# Patient Record
Sex: Female | Born: 1940 | Race: Black or African American | Hispanic: No | Marital: Married | Smoking: Never smoker
Health system: Southern US, Community
[De-identification: ages and names within clinical notes are randomized; demographics above are authoritative.]

---

## 1999-10-22 ENCOUNTER — Ambulatory Visit (HOSPITAL_COMMUNITY): Admission: RE | Admit: 1999-10-22 | Discharge: 1999-10-22 | Payer: Self-pay | Admitting: Cardiovascular Disease

## 2001-04-14 ENCOUNTER — Emergency Department (HOSPITAL_COMMUNITY): Admission: EM | Admit: 2001-04-14 | Discharge: 2001-04-14 | Payer: Self-pay | Admitting: Emergency Medicine

## 2001-04-14 ENCOUNTER — Encounter: Payer: Self-pay | Admitting: Emergency Medicine

## 2002-05-31 ENCOUNTER — Emergency Department (HOSPITAL_COMMUNITY): Admission: EM | Admit: 2002-05-31 | Discharge: 2002-05-31 | Payer: Self-pay | Admitting: Emergency Medicine

## 2007-10-05 ENCOUNTER — Inpatient Hospital Stay (HOSPITAL_COMMUNITY): Admission: AD | Admit: 2007-10-05 | Discharge: 2007-10-06 | Payer: Self-pay | Admitting: Cardiovascular Disease

## 2009-10-08 ENCOUNTER — Emergency Department (HOSPITAL_COMMUNITY): Admission: EM | Admit: 2009-10-08 | Discharge: 2009-10-08 | Payer: Self-pay | Admitting: Family Medicine

## 2009-10-08 ENCOUNTER — Emergency Department (HOSPITAL_COMMUNITY): Admission: EM | Admit: 2009-10-08 | Discharge: 2009-10-08 | Payer: Self-pay | Admitting: Emergency Medicine

## 2010-12-03 ENCOUNTER — Inpatient Hospital Stay (INDEPENDENT_AMBULATORY_CARE_PROVIDER_SITE_OTHER)
Admission: RE | Admit: 2010-12-03 | Discharge: 2010-12-03 | Disposition: A | Discharge status: Home / Self Care | Payer: Medicare Other | Source: Ambulatory Visit | Attending: Family Medicine | Admitting: Family Medicine

## 2010-12-03 DIAGNOSIS — S20219A Contusion of unspecified front wall of thorax, initial encounter: Secondary | ICD-10-CM

## 2010-12-07 LAB — POCT URINALYSIS DIP (DEVICE)
Bilirubin Urine: NEGATIVE
Ketones, ur: NEGATIVE mg/dL
Nitrite: NEGATIVE
Protein, ur: NEGATIVE mg/dL

## 2010-12-07 LAB — DIFFERENTIAL
Basophils Absolute: 0 10*3/uL (ref 0.0–0.1)
Eosinophils Relative: 1 % (ref 0–5)
Lymphocytes Relative: 13 % (ref 12–46)
Lymphs Abs: 0.6 10*3/uL — ABNORMAL LOW (ref 0.7–4.0)
Neutro Abs: 4 10*3/uL (ref 1.7–7.7)

## 2010-12-07 LAB — CBC
HCT: 36.8 % (ref 36.0–46.0)
Platelets: 257 10*3/uL (ref 150–400)
WBC: 4.8 10*3/uL (ref 4.0–10.5)

## 2010-12-11 ENCOUNTER — Other Ambulatory Visit: Payer: Self-pay | Admitting: Cardiovascular Disease

## 2010-12-11 DIAGNOSIS — Z1231 Encounter for screening mammogram for malignant neoplasm of breast: Secondary | ICD-10-CM

## 2010-12-15 ENCOUNTER — Ambulatory Visit
Admission: RE | Admit: 2010-12-15 | Discharge: 2010-12-15 | Disposition: A | Discharge status: Home / Self Care | Payer: BC Managed Care – PPO | Source: Ambulatory Visit | Attending: Cardiovascular Disease | Admitting: Cardiovascular Disease

## 2010-12-15 ENCOUNTER — Other Ambulatory Visit: Payer: Self-pay | Admitting: Cardiovascular Disease

## 2010-12-15 DIAGNOSIS — M79601 Pain in right arm: Secondary | ICD-10-CM

## 2010-12-15 DIAGNOSIS — R200 Anesthesia of skin: Secondary | ICD-10-CM

## 2010-12-18 ENCOUNTER — Other Ambulatory Visit: Payer: Self-pay | Admitting: Cardiovascular Disease

## 2010-12-18 ENCOUNTER — Ambulatory Visit: Payer: Self-pay

## 2010-12-18 DIAGNOSIS — M79601 Pain in right arm: Secondary | ICD-10-CM

## 2010-12-18 DIAGNOSIS — R42 Dizziness and giddiness: Secondary | ICD-10-CM

## 2010-12-18 DIAGNOSIS — R55 Syncope and collapse: Secondary | ICD-10-CM

## 2010-12-18 DIAGNOSIS — R200 Anesthesia of skin: Secondary | ICD-10-CM

## 2010-12-18 DIAGNOSIS — M542 Cervicalgia: Secondary | ICD-10-CM

## 2010-12-24 ENCOUNTER — Inpatient Hospital Stay: Admission: RE | Admit: 2010-12-24 | Payer: Self-pay | Source: Ambulatory Visit

## 2010-12-24 ENCOUNTER — Ambulatory Visit
Admission: RE | Admit: 2010-12-24 | Discharge: 2010-12-24 | Disposition: A | Discharge status: Home / Self Care | Payer: BC Managed Care – PPO | Source: Ambulatory Visit | Attending: Cardiovascular Disease | Admitting: Cardiovascular Disease

## 2010-12-24 DIAGNOSIS — R55 Syncope and collapse: Secondary | ICD-10-CM

## 2010-12-24 DIAGNOSIS — R42 Dizziness and giddiness: Secondary | ICD-10-CM

## 2010-12-24 DIAGNOSIS — R200 Anesthesia of skin: Secondary | ICD-10-CM

## 2010-12-24 DIAGNOSIS — M79601 Pain in right arm: Secondary | ICD-10-CM

## 2010-12-24 DIAGNOSIS — M542 Cervicalgia: Secondary | ICD-10-CM

## 2010-12-25 ENCOUNTER — Other Ambulatory Visit: Payer: Self-pay

## 2011-02-02 NOTE — Cardiovascular Report (Signed)
NAME:  [name], [name] NO.:  [id number]   MEDICAL RECORD NO.:  [id number]          PATIENT TYPE:  INP   LOCATION:  4703                         FACILITY:  [facility]   PHYSICIAN:  Ricki Rodriguez, M.D.  DATE OF BIRTH:  28-Feb-1941   DATE OF PROCEDURE:  10/06/2007  DATE OF DISCHARGE:                            CARDIAC CATHETERIZATION   Left heart catheterization, selective coronary angiography, left  ventricular function study.   INDICATIONS:  This 70 year old black female with a known coronary artery  disease had typical chest pain and EKG changes of lateral ischemia.   APPROACH:  Right femoral artery using 4-French sheath and catheters.   COMPLICATIONS:  None.   45 mL of dye was used.   HEMODYNAMIC DATA:  The aortic pressure was 101/65 and left ventricular  pressure was 100/11.   CORONARY ANATOMY:  The left main coronary artery had minimal  irregularities, otherwise was unremarkable.   Left anterior descending coronary artery:  The left anterior descending  coronary artery also showed luminal irregularities in the proximal  portion.  Rest of the vessel and diagonal vessels were unremarkable.   Left circumflex coronary artery:  The left circumflex coronary artery  and its obtuse marginal branch were unremarkable.   Right coronary artery:  The right coronary artery was dominant and  unremarkable.   Left ventriculogram:  The left ventriculogram was normal with an  ejection fraction of 65%.   IMPRESSION:  1. Minimal coronary artery disease.  2. Normal left ventricular systolic function.   RECOMMENDATIONS:  This patient will be treated medically.      Ricki Rodriguez, M.D.  Electronically Signed     ASK/MEDQ  D:  10/06/2007  T:  10/06/2007  Job:  [id number]

## 2011-02-05 NOTE — Discharge Summary (Signed)
[name], [name] NO.:  [id number]   MEDICAL RECORD NO.:  [id number]          PATIENT TYPE:  INP   LOCATION:  4703                         FACILITY:  [facility]   PHYSICIAN:  Ricki Rodriguez, M.D.  DATE OF BIRTH:  02/01/1941   DATE OF ADMISSION:  10/05/2007  DATE OF DISCHARGE:  10/06/2007                               DISCHARGE SUMMARY   FINAL DIAGNOSIS:  Coronary atherosclerosis of native coronary vessel.   PRINCIPAL PROCEDURE:  Left heart catheterization, selective coronary  angiography, left ventricle function study done by Dr. Orpah Cobb on  October 06, 2007.   DISCHARGE MEDICATIONS:  1. Toprol XL 25 mg 1 daily.  2. Aspirin 81 mg 1 daily.  3. Flexeril 10 mg 1/2 tablet at bedtime as needed.  4. Zocor 20 mg 1 daily.   DISCHARGE DIET:  Low-sodium heart-healthy diet.   DISCHARGE ACTIVITY:  The patient to increase activity slowly.   FOLLOWUP:  Followup by Dr. Orpah Cobb in 3 to 4 weeks.  The patient to  call [phone] for appointment.   HISTORY:  This 70 year old black female presented with left-sided chest  pain, left arm numbness along with nausea without shortness of breath.  Had minimal coronary artery disease 8 years ago on cardiac  catheterization.   PHYSICAL EXAMINATION:  VITAL SIGNS:  Temperature 98, pulse 58,  respirations 16, blood pressure 115/70.  GENERAL:  The patient is a well-built, well-nourished black female in no  acute distress.  HEENT:  The patient is normocephalic, atraumatic with pupils equally  reacting to light.  NECK:  Supple.  LUNGS:  Clear bilaterally.  HEART:  Normal S1-S2.  ABDOMEN:  Soft and nontender.  EXTREMITIES:  No edema, cyanosis, clubbing.  SKIN:  No rash.  CNS:  Cranial nerves grossly intact.  The patient moves all four  extremities.   LABORATORY DATA:  Near-normal hemoglobin/hematocrit, WBC count, platelet  count.  Normal electrolytes, BUN, creatinine and glucose with cardiac  enzymes normal x 2.  EKG  normal sinus rhythm with nonspecific T-wave changes.  Cardiac catheterization showed minimal coronary artery disease and  normal LV systolic function.   HOSPITAL COURSE:  The patient was admitted to telemetry unit.  Myocardial infarction was ruled out.  She underwent cardiac  catheterization that failed to show any significant coronary artery  disease.  Hence, the patient was discharged home with adjustment of her  medications, and she will be followed by me in 3 to 4 weeks.      Ricki Rodriguez, M.D.  Electronically Signed     ASK/MEDQ  D:  11/08/2007  T:  11/09/2007  Job:  [id number]

## 2011-02-05 NOTE — Cardiovascular Report (Signed)
[place]  Patient:    [name]                 MRN: [id number] Proc. Date: 10/22/99 Adm. Date:  [id number] Attending:  Ricki Rodriguez CC:         Cardiac Catheterization Laboratory                        Cardiac Catheterization  CINE:  [id number]  PROCEDURE:  Left heart catheterization, selective coronary angiography, left ventricular function study, and descending aortography.  CARDIOLOGIST:  Ricki Rodriguez, M.D.  INDICATIONS:  This 70 year old obese black female has exertional and nonexertional chest pain, along with an abnormal treadmill stress test and abnormal stress echocardiogram, with a known history of coronary artery disease in the past.  COMPLICATIONS:  None; however, there was technical difficulty in deploying the Perclose suture.  Manual pressure was held, with no hematoma.  APPROACH:  Right femoral artery using 6-French diagnostic catheters.  HEMODYNAMIC DATA: Left ventricular pressure:  129/17. Aortic pressure:  126/60.  LEFT VENTRICULOGRAM:  The left ventriculogram showed a mild anterior wall and apical hypokinesia, with an ejection fraction of 50%-55%.  DESCENDING AORTOGRAM:  The descending aortogram was essentially unremarkable; however, there were mild luminal irregularities of both renal arteries.  CORONARY ANATOMY: 1. Left main coronary artery:  The left main coronary artery was unremarkable. 2. Left anterior descending coronary artery:  The left anterior descending coronary    artery showed diffuse narrowing of the distal half of the vessel, and the    diagonal-I had ostial 20%-30% stenosis. 3. Left circumflex coronary artery:  The left circumflex coronary artery was    essentially unremarkable. 4. Right coronary artery:  The right coronary artery was dominant and had a    total occlusion of the distal branch of the posterolateral artery.  IMPRESSION: 1. Mild coronary artery disease. 2. Mild left  ventricular systolic dysfunction. 3. Minimal general artery disease.  RECOMMENDATIONS:  This patient will be treated medically.DD:  10/22/99 TD:  10/22/99 Job: [id number]

## 2011-06-11 LAB — BASIC METABOLIC PANEL
CO2: 28
Chloride: 103
Creatinine, Ser: 0.74
GFR calc Af Amer: >60
Potassium: 3.7
Sodium: 138

## 2011-06-11 LAB — CBC
HCT: 34.6 — ABNORMAL LOW
Hemoglobin: 11.9 — ABNORMAL LOW
MCHC: 34.5
MCV: 86
RBC: 4.03
WBC: 5.7

## 2011-06-11 LAB — CK TOTAL AND CKMB (NOT AT ARMC)
CK, MB: 0.9
CK, MB: 1.1
Relative Index: 1
Total CK: 106
Total CK: 112

## 2016-03-03 ENCOUNTER — Other Ambulatory Visit: Payer: Self-pay | Admitting: Radiology

## 2016-09-28 DIAGNOSIS — J22 Unspecified acute lower respiratory infection: Secondary | ICD-10-CM | POA: Diagnosis not present

## 2016-09-28 DIAGNOSIS — J019 Acute sinusitis, unspecified: Secondary | ICD-10-CM | POA: Diagnosis not present

## 2016-09-28 DIAGNOSIS — B9689 Other specified bacterial agents as the cause of diseases classified elsewhere: Secondary | ICD-10-CM | POA: Diagnosis not present

## 2016-10-05 DIAGNOSIS — B9689 Other specified bacterial agents as the cause of diseases classified elsewhere: Secondary | ICD-10-CM | POA: Diagnosis not present

## 2016-10-05 DIAGNOSIS — J019 Acute sinusitis, unspecified: Secondary | ICD-10-CM | POA: Diagnosis not present

## 2016-10-05 DIAGNOSIS — J22 Unspecified acute lower respiratory infection: Secondary | ICD-10-CM | POA: Diagnosis not present

## 2017-02-22 DIAGNOSIS — R921 Mammographic calcification found on diagnostic imaging of breast: Secondary | ICD-10-CM | POA: Diagnosis not present

## 2017-03-06 DIAGNOSIS — L0231 Cutaneous abscess of buttock: Secondary | ICD-10-CM | POA: Diagnosis not present

## 2017-04-06 ENCOUNTER — Encounter (HOSPITAL_COMMUNITY): Payer: Self-pay | Admitting: Emergency Medicine

## 2017-04-06 ENCOUNTER — Ambulatory Visit (INDEPENDENT_AMBULATORY_CARE_PROVIDER_SITE_OTHER): Payer: Medicare Other

## 2017-04-06 ENCOUNTER — Ambulatory Visit (HOSPITAL_COMMUNITY)
Admission: EM | Admit: 2017-04-06 | Discharge: 2017-04-06 | Disposition: A | Discharge status: Home / Self Care | Payer: Medicare Other | Attending: Internal Medicine | Admitting: Internal Medicine

## 2017-04-06 DIAGNOSIS — S60212A Contusion of left wrist, initial encounter: Secondary | ICD-10-CM

## 2017-04-06 DIAGNOSIS — M25532 Pain in left wrist: Secondary | ICD-10-CM

## 2017-04-06 DIAGNOSIS — M79642 Pain in left hand: Secondary | ICD-10-CM | POA: Diagnosis not present

## 2017-04-06 DIAGNOSIS — S6992XA Unspecified injury of left wrist, hand and finger(s), initial encounter: Secondary | ICD-10-CM | POA: Diagnosis not present

## 2017-04-06 NOTE — Discharge Instructions (Signed)
Xray was negative  Apply splint and call ortho office in the morning May use ice and heat as needed

## 2017-04-06 NOTE — ED Triage Notes (Signed)
PT fell at 1430. PT attempted to catch herself. PT had pain in left wrist but had full ROM. PT now reports limited ROM in left hand at 1730. Radial pulse intact.

## 2017-04-06 NOTE — ED Provider Notes (Signed)
CSN: [id number]     Arrival date & time 04/06/17  1952 History   First MD Initiated Contact with Patient 04/06/17 2014     Chief Complaint  Patient presents with  . Wrist Pain   (Consider location/radiation/quality/duration/timing/severity/associated sxs/prior Treatment) Pt fell a few minutes ago while getting into a car. States that she felt fine then she began having swelling to wrist and lt finger area. Denies any previous injury. Took an asa pta for pain.        History reviewed. No pertinent past medical history. History reviewed. No pertinent surgical history. No family history on file. Social History  Substance Use Topics  . Smoking status: Never Smoker  . Smokeless tobacco: Never Used  . Alcohol use No   OB History    No data available     Review of Systems  Constitutional: Negative.   Respiratory: Negative.   Cardiovascular: Negative.   Musculoskeletal: Positive for joint swelling.       Pain to lt wrist   Skin:       Swelling to wrist and lt hand area .   Neurological: Negative.     Allergies  Penicillins  Home Medications   Prior to Admission medications   Not on File   Meds Ordered and Administered this Visit  Medications - No data to display  BP 132/75   Pulse 86   Temp 100 F (37.8 C) (Oral)   Resp 16   Ht 5\' 6"  (1.676 m)   Wt 189 lb (85.7 kg)   SpO2 100%   BMI 30.51 kg/m  No data found.   Physical Exam  Constitutional: She appears well-developed.  Cardiovascular: Normal rate and regular rhythm.   Pulmonary/Chest: Effort normal and breath sounds normal.  Abdominal: Soft. Bowel sounds are normal.  Musculoskeletal: She exhibits edema and tenderness.  Neurological: She is alert.  Skin: Skin is warm. Capillary refill takes less than 2 seconds.  +1 edema to lt wrist area, full ROM to digits. Strong pulses.     Urgent Care Course     Procedures (including critical care time)  Labs Review Labs Reviewed - No data to  display  Imaging Review Dg Hand Complete Left  Result Date: 04/06/2017 CLINICAL DATA:  Fall today.  Hand pain EXAM: LEFT HAND - COMPLETE 3+ VIEW COMPARISON:  None. FINDINGS: There is no evidence of fracture or dislocation. There is no evidence of arthropathy or other focal bone abnormality. Soft tissues are unremarkable. IMPRESSION: Negative. Electronically Signed   By: Franchot Gallo M.D.   On: 04/06/2017 20:57             MDM   1. Acute pain of left wrist   2. Contusion of left wrist, initial encounter    Use motrin and tylenol as needed  appyl ice and heat PRN  Xray was negative  Apply splint and call ortho office in the morning May use ice and heat as needed    Melanee Left, NP 04/08/17 [phone]

## 2017-09-12 DIAGNOSIS — M25532 Pain in left wrist: Secondary | ICD-10-CM | POA: Diagnosis not present

## 2019-11-29 ENCOUNTER — Ambulatory Visit: Payer: Medicare Other | Attending: Family

## 2019-11-29 DIAGNOSIS — Z23 Encounter for immunization: Secondary | ICD-10-CM

## 2019-11-29 NOTE — Progress Notes (Signed)
   Covid-19 Vaccination Clinic  Name:  [name]    MRN: [id number] DOB: 07/06/1941  11/29/2019  [name] was observed post Covid-19 immunization for 15 minutes without incident. She was provided with Vaccine Information Sheet and instruction to access the V-Safe system.   [name] was instructed to call 911 with any severe reactions post vaccine: Marland Kitchen Difficulty breathing  . Swelling of face and throat  . A fast heartbeat  . A bad rash all over body  . Dizziness and weakness   Immunizations Administered    Name Date Dose VIS Date Route   Moderna COVID-19 Vaccine 11/29/2019 11:14 AM 0.5 mL 08/21/2019 Intramuscular   Manufacturer: Moderna   Lot: [id number]   [place]:[id number]

## 2020-01-01 ENCOUNTER — Ambulatory Visit: Payer: Medicare Other | Attending: Internal Medicine

## 2020-01-01 DIAGNOSIS — Z23 Encounter for immunization: Secondary | ICD-10-CM

## 2020-01-01 NOTE — Progress Notes (Signed)
   Covid-19 Vaccination Clinic  Name:  [name]    MRN: [id number] DOB: 1941-05-22  01/01/2020  [name] was observed post Covid-19 immunization for 15 minutes without incident. She was provided with Vaccine Information Sheet and instruction to access the V-Safe system.   [name] was instructed to call 911 with any severe reactions post vaccine: Marland Kitchen Difficulty breathing  . Swelling of face and throat  . A fast heartbeat  . A bad rash all over body  . Dizziness and weakness   Immunizations Administered    Name Date Dose VIS Date Route   Moderna COVID-19 Vaccine 01/01/2020  2:01 PM 0.5 mL 08/21/2019 Intramuscular   Manufacturer: Moderna   Lot: [id number]   [place]:[id number]

## 2020-06-21 ENCOUNTER — Emergency Department (HOSPITAL_COMMUNITY): Payer: Medicare PPO

## 2020-06-21 ENCOUNTER — Emergency Department (HOSPITAL_COMMUNITY)
Admission: EM | Admit: 2020-06-21 | Discharge: 2020-06-21 | Disposition: A | Discharge status: Home / Self Care | Payer: Medicare PPO | Attending: Emergency Medicine | Admitting: Emergency Medicine

## 2020-06-21 ENCOUNTER — Other Ambulatory Visit: Payer: Self-pay

## 2020-06-21 ENCOUNTER — Encounter (HOSPITAL_COMMUNITY): Payer: Self-pay

## 2020-06-21 DIAGNOSIS — R071 Chest pain on breathing: Secondary | ICD-10-CM | POA: Insufficient documentation

## 2020-06-21 DIAGNOSIS — S2232XA Fracture of one rib, left side, initial encounter for closed fracture: Secondary | ICD-10-CM

## 2020-06-21 DIAGNOSIS — W010XXA Fall on same level from slipping, tripping and stumbling without subsequent striking against object, initial encounter: Secondary | ICD-10-CM | POA: Diagnosis not present

## 2020-06-21 DIAGNOSIS — S0990XA Unspecified injury of head, initial encounter: Secondary | ICD-10-CM

## 2020-06-21 DIAGNOSIS — W19XXXA Unspecified fall, initial encounter: Secondary | ICD-10-CM

## 2020-06-21 MED ORDER — LIDOCAINE 5 % EX PTCH
1.0000 | MEDICATED_PATCH | CUTANEOUS | Status: DC
  Administered 2020-06-21: 1 via TRANSDERMAL
  Filled 2020-06-21: qty 1

## 2020-06-21 MED ORDER — LIDOCAINE 5 % EX PTCH: 1.0000 | MEDICATED_PATCH | CUTANEOUS | 0 refills | Status: DC

## 2020-06-21 NOTE — ED Provider Notes (Signed)
Medical screening examination/treatment/procedure(s) were conducted as a shared visit with non-physician practitioner(s) and myself.  I personally evaluated the patient during the encounter.    79 year old female here after falling several days ago.  Head CT negative.  Does have 1/9 rib fracture.  Will treat symptomatically and discharged   Lacretia Leigh, MD 06/21/20 1811

## 2020-06-21 NOTE — Discharge Instructions (Addendum)
Take ibuprofen 3 times a day with meals.  Do not take other anti-inflammatories at the same time (Advil, Motrin, naproxen, Aleve). You may supplement with Tylenol if you need further pain control. Use lidocaine patch as needed for further pain control.  Use the incentive spirometer (breathing tube) at least 10 times a day for the next week. Follow up with your primary care doctor as needed for reevaluation.  Return to the ER if you develop fevers, difficulty breathing, severe worsening pain, or any new, worsening, or concerning symptoms.

## 2020-06-21 NOTE — ED Notes (Signed)
Patient given incentive spirometer and instructions given on how to use at home

## 2020-06-21 NOTE — ED Triage Notes (Addendum)
Pt presents with c/o fall that occurred one week ago. Pt reports she hit the side of her head when she fell on the left side. Pt reports that since then, she has experienced some dizziness as well as pain on the left side of her body from the fall. Pt has been in contact with her PCP and was told to come here for x-rays. Pt is alert and oriented at this time, no neuro deficits.

## 2020-06-21 NOTE — ED Provider Notes (Signed)
[place] DEPT Provider Note   CSN: [id number] Arrival date & time: 06/21/20  1118     History Chief Complaint  Patient presents with  . Fall    [name] is a 79 y.o. female presenting for evaluation of head pain and L sided pain after a fall.   Pt states 7 days ago she slipped on some papers and fell, hitting the L side of her head on metal chair legs and her L side on the stair. She states she initially was sore, but reports worsening pain over the past week. She has had 1 dose of tylenol, nothing else for pain. She did not lose consciousness. She denies neck or back pain. She is not on blood thinners. She states her pain is worse with movement and inspiration. She reports no medical problems, takes no medications daily. She is not on blood thinners.    HPI     History reviewed. No pertinent past medical history.  There are no problems to display for this patient.   History reviewed. No pertinent surgical history.   OB History   No obstetric history on file.     History reviewed. No pertinent family history.  Social History   Tobacco Use  . Smoking status: Never Smoker  . Smokeless tobacco: Never Used  Substance Use Topics  . Alcohol use: No  . Drug use: No    Home Medications Prior to Admission medications   Medication Sig Start Date End Date Taking? Authorizing Provider  lidocaine (LIDODERM) 5 % Place 1 patch onto the skin daily. Remove & Discard patch within 12 hours or as directed by MD 06/21/20   Miracle Mongillo, PA-C    Allergies    Iodinated diagnostic agents and Penicillins  Review of Systems   Review of Systems  Musculoskeletal: Positive for myalgias.  Neurological: Positive for headaches.  All other systems reviewed and are negative.   Physical Exam Updated Vital Signs BP 127/65   Pulse 62   Temp 97.9 F (36.6 C) (Oral)   Resp 20   SpO2 99%   Physical Exam Vitals and nursing note  reviewed.  Constitutional:      General: She is not in acute distress.    Appearance: She is well-developed.     Comments: Appears nontoxic  HENT:     Head: Normocephalic and atraumatic.      Comments: No external signs of head trauma.  No laceration or hematoma.  Mild TTP of left parietal scalp. Eyes:     Extraocular Movements: Extraocular movements intact.     Conjunctiva/sclera: Conjunctivae normal.     Pupils: Pupils are equal, round, and reactive to light.  Cardiovascular:     Rate and Rhythm: Normal rate and regular rhythm.     Pulses: Normal pulses.  Pulmonary:     Effort: Pulmonary effort is normal. No respiratory distress.     Breath sounds: Normal breath sounds. No wheezing.     Comments: Speaking in full sentences. Clear lung sounds in all fields.  Abdominal:     General: There is no distension.     Palpations: Abdomen is soft. There is no mass.     Tenderness: There is no abdominal tenderness. There is no guarding or rebound.  Musculoskeletal:        General: Normal range of motion.     Cervical back: Normal range of motion and neck supple.     Comments: Tenderness palpation of left lateral ribs.  No pain of back or midline spine.  No pain of anterior chest.  Skin:    General: Skin is warm and dry.     Capillary Refill: Capillary refill takes less than 2 seconds.  Neurological:     Mental Status: She is alert and oriented to person, place, and time.     ED Results / Procedures / Treatments   Labs (all labs ordered are listed, but only abnormal results are displayed) Labs Reviewed - No data to display  EKG None  Radiology DG Ribs Unilateral W/Chest Left  Result Date: 06/21/2020 CLINICAL DATA:  Status post fall with subsequent left-sided rib pain. EXAM: LEFT RIBS AND CHEST - 3+ VIEW COMPARISON:  None. FINDINGS: Acute ninth left rib fracture is seen. There is no evidence of pneumothorax or pleural effusion. Mild areas of atelectasis are seen within the  bilateral lung bases. Heart size and mediastinal contours are within normal limits. IMPRESSION: 1. Acute ninth left rib fracture. 2. Mild bibasilar atelectasis. Electronically Signed   By: Virgina Norfolk M.D.   On: 06/21/2020 17:42   CT Head Wo Contrast  Result Date: 06/21/2020 CLINICAL DATA:  The patient suffered a blow to the left side of the head and a fall 1 week ago. Initial encounter. EXAM: CT HEAD WITHOUT CONTRAST TECHNIQUE: Contiguous axial images were obtained from the base of the skull through the vertex without intravenous contrast. COMPARISON:  Head CT scan 12/24/2010. FINDINGS: Brain: No evidence of acute infarction, hemorrhage, hydrocephalus, extra-axial collection or mass lesion/mass effect. Vascular: No hyperdense vessel or unexpected calcification. Skull: Intact.  No focal lesion. Sinuses/Orbits: Negative. Other: None. IMPRESSION: Negative head CT. Electronically Signed   By: Inge Rise M.D.   On: 06/21/2020 13:09    Procedures Procedures (including critical care time)  Medications Ordered in ED Medications  lidocaine (LIDODERM) 5 % 1 patch (1 patch Transdermal Patch Applied 06/21/20 1712)    ED Course  I have reviewed the triage vital signs and the nursing notes.  Pertinent labs & imaging results that were available during my care of the patient were reviewed by me and considered in my medical decision making (see chart for details).    MDM Rules/Calculators/A&P                          Patient presenting for evaluation after a fall.  Patient reporting trauma to the head and left side ribs.  On exam, patient peers nontoxic.  This occurred a week ago.  Patient with focal pain of her left lateral ribs and left hand.  CT head obtained from triage without signs of acute trauma.  Will obtain x-ray of the ribs for further evaluation due to patient's pain.  Lidoderm patch given.  X-rays viewed interpreted by me, shows rib fracture.  Per radiology, is single isolated rib  fracture.  Case discussed with attending, Dr. Zenia Resides evaluated the patient.  On reassessment, patient reports pain is improved.  She is able to sit up in bed and ambulate without difficulty.  She does not feel she needs narcotic pain medicine for home, can manage pain with lidocaine patch and over-the-counter Tylenol and ibuprofen. Will give incentive spirometer.  Will plan to treat rib fracture symptomatically, close follow-up with PCP.  At this time, patient appears safe for discharge.  Return precautions given.  Patient states she understands and agrees to plan.  Final Clinical Impression(s) / ED Diagnoses Final diagnoses:  Closed fracture of one rib  of left side, initial encounter  Injury of head, initial encounter  Fall, initial encounter    Rx / DC Orders ED Discharge Orders         Ordered    lidocaine (LIDODERM) 5 %  Every 24 hours        06/21/20 1816           Franchot Heidelberg, PA-C 06/21/20 1818    Lacretia Leigh, MD 06/22/20 1539

## 2020-08-28 ENCOUNTER — Ambulatory Visit: Payer: Medicare Other | Attending: Critical Care Medicine

## 2020-08-28 DIAGNOSIS — Z23 Encounter for immunization: Secondary | ICD-10-CM

## 2020-08-28 NOTE — Progress Notes (Signed)
   Covid-19 Vaccination Clinic  Name:  [name]    MRN: [id number] DOB: August 07, 1941  08/28/2020  [name] was observed post Covid-19 immunization for 15 minutes without incident. She was provided with Vaccine Information Sheet and instruction to access the V-Safe system.   [name] was instructed to call 911 with any severe reactions post vaccine: Marland Kitchen Difficulty breathing  . Swelling of face and throat  . A fast heartbeat  . A bad rash all over body  . Dizziness and weakness   Immunizations Administered    No immunizations on file.

## 2021-02-28 ENCOUNTER — Emergency Department (HOSPITAL_COMMUNITY)
Admission: EM | Admit: 2021-02-28 | Discharge: 2021-02-28 | Disposition: A | Discharge status: Home / Self Care | Payer: Medicare PPO | Attending: Emergency Medicine | Admitting: Emergency Medicine

## 2021-02-28 ENCOUNTER — Other Ambulatory Visit: Payer: Self-pay

## 2021-02-28 DIAGNOSIS — K644 Residual hemorrhoidal skin tags: Secondary | ICD-10-CM | POA: Diagnosis not present

## 2021-02-28 DIAGNOSIS — K625 Hemorrhage of anus and rectum: Secondary | ICD-10-CM | POA: Diagnosis present

## 2021-02-28 DIAGNOSIS — K649 Unspecified hemorrhoids: Secondary | ICD-10-CM

## 2021-02-28 LAB — COMPREHENSIVE METABOLIC PANEL
ALT: 13 U/L (ref 0–44)
AST: 19 U/L (ref 15–41)
Albumin: 3.6 g/dL (ref 3.5–5.0)
Alkaline Phosphatase: 74 U/L (ref 38–126)
Anion gap: 8 (ref 5–15)
BUN: 21 mg/dL (ref 8–23)
CO2: 26 mmol/L (ref 22–32)
Calcium: 9.6 mg/dL (ref 8.9–10.3)
Chloride: 104 mmol/L (ref 98–111)
Creatinine, Ser: 1.08 mg/dL — ABNORMAL HIGH (ref 0.44–1.00)
GFR, Estimated: 52 mL/min — ABNORMAL LOW (ref >60)
Glucose, Bld: 175 mg/dL — ABNORMAL HIGH (ref 70–99)
Potassium: 4.1 mmol/L (ref 3.5–5.1)
Sodium: 138 mmol/L (ref 135–145)
Total Bilirubin: 0.5 mg/dL (ref 0.3–1.2)
Total Protein: 7.3 g/dL (ref 6.5–8.1)

## 2021-02-28 LAB — TYPE AND SCREEN
ABO/RH(D): A POS
Antibody Screen: NEGATIVE

## 2021-02-28 LAB — CBC WITH DIFFERENTIAL/PLATELET
Abs Immature Granulocytes: 0.01 10*3/uL (ref 0.00–0.07)
Basophils Absolute: 0 10*3/uL (ref 0.0–0.1)
Basophils Relative: 0 %
Eosinophils Absolute: 0.2 10*3/uL (ref 0.0–0.5)
Eosinophils Relative: 3 %
HCT: 30.2 % — ABNORMAL LOW (ref 36.0–46.0)
Hemoglobin: 9.7 g/dL — ABNORMAL LOW (ref 12.0–15.0)
Immature Granulocytes: 0 %
Lymphocytes Relative: 35 %
Lymphs Abs: 1.8 10*3/uL (ref 0.7–4.0)
MCH: 28 pg (ref 26.0–34.0)
MCHC: 32.1 g/dL (ref 30.0–36.0)
MCV: 87 fL (ref 80.0–100.0)
Monocytes Absolute: 0.5 10*3/uL (ref 0.1–1.0)
Monocytes Relative: 10 %
Neutro Abs: 2.6 10*3/uL (ref 1.7–7.7)
Neutrophils Relative %: 52 %
Platelets: 327 10*3/uL (ref 150–400)
RBC: 3.47 MIL/uL — ABNORMAL LOW (ref 3.87–5.11)
RDW: 13.7 % (ref 11.5–15.5)
WBC: 5 10*3/uL (ref 4.0–10.5)
nRBC: 0 % (ref 0.0–0.2)

## 2021-02-28 LAB — PROTIME-INR
INR: 0.9 (ref 0.8–1.2)
Prothrombin Time: 12.6 seconds (ref 11.4–15.2)

## 2021-02-28 LAB — POC OCCULT BLOOD, ED: Fecal Occult Bld: POSITIVE — AB

## 2021-02-28 MED ORDER — HYDROCORTISONE ACETATE 25 MG RE SUPP: 25.0000 mg | Freq: Two times a day (BID) | RECTAL | 0 refills | Status: AC

## 2021-02-28 NOTE — ED Notes (Signed)
ED Provider at bedside. 

## 2021-02-28 NOTE — ED Provider Notes (Signed)
Emergency Medicine Provider Triage Evaluation Note  [name] , a 80 y.o. female  was evaluated in triage.  Pt complains of blood in the stool.  States that happened this morning, looks like a blood clot.  She has a history of hemorrhoids, but states this is different.  Last week she also had some upper abdominal pain which localized down to her pelvic area today.  She reports she has been feeling dizzy in the last week.  No history of ulcers, atrial fibrillation,, coagulopathy.  Review of Systems  Positive: Blood in stool, abdominal pain, dizziness Negative: Nausea, vomiting, dysuria  Physical Exam  There were no vitals taken for this visit. Gen:   Awake, no distress   Resp:  Normal effort  MSK:   Moves extremities without difficulty  Other:  Rhythm is regular.  Denies any abdominal tenderness.  Medical Decision Making  Medically screening exam initiated at 7:16 PM.  Appropriate orders placed.  Cordelia Pen was informed that the remainder of the evaluation will be completed by another provider, this initial triage assessment does not replace that evaluation, and the importance of remaining in the ED until their evaluation is complete.     Sherrill Raring, PA-C 02/28/21 1918    Quintella Reichert, MD 03/01/21 [phone]

## 2021-02-28 NOTE — ED Provider Notes (Signed)
[facility] DEPT Provider Note   CSN: [id number] Arrival date & time: 02/28/21  1835     History Chief Complaint  Patient presents with   Rectal Bleeding    [name] is a 80 y.o. female.  Patient presents emergency department for evaluation of rectal bleeding.  She has a history of hemorrhoids.  She states that today she noted bright red blood on the toilet tissue and bright red blood in the toilet.  She also noted a small clot that was darker.  The clot worried her, prompting emergency department visit.  She states over the past week she has had intermittent abdominal pains on the left side that moved down into the pelvis.  Currently, she does not have any abdominal pain.  No hematuria or irritative UTI symptoms including dysuria, increased frequency or urgency.  She reports remote colonoscopy but does not remember any mention of diverticulosis or diverticulitis history.  She denies lightheadedness or dizziness to me.  She does report increased pain with hemorrhoids over the past week.  She has been applying a topical antibiotic and Preparation H.  No chest pain or shortness of breath.  No increased fatigue.  She does not take any medications including anticoagulants.  She only takes vitamins.        No past medical history on file.  There are no problems to display for this patient.   No past surgical history on file.   OB History   No obstetric history on file.     No family history on file.  Social History   Tobacco Use   Smoking status: Never   Smokeless tobacco: Never  Substance Use Topics   Alcohol use: No   Drug use: No    Home Medications Prior to Admission medications   Medication Sig Start Date End Date Taking? Authorizing Provider  cholecalciferol (VITAMIN D3) 25 MCG (1000 UNIT) tablet Take 1,000 Units by mouth every other day.   Yes [provider]  hydrocortisone (ANUSOL-HC) 25 MG suppository Place 1  suppository (25 mg total) rectally 2 (two) times daily. 02/28/21  Yes Carlisle Cater, PA-C  Multiple Vitamin (MULTIVITAMIN ADULT) TABS Take 2 tablets by mouth in the morning and at bedtime.   Yes [provider]  lidocaine (LIDODERM) 5 % Place 1 patch onto the skin daily. Remove & Discard patch within 12 hours or as directed by MD Patient not taking: Reported on 02/28/2021 06/21/20   Caccavale, Sophia, PA-C    Allergies    Iodinated diagnostic agents and Penicillins  Review of Systems   Review of Systems  Constitutional:  Negative for fever.  HENT:  Negative for rhinorrhea and sore throat.   Eyes:  Negative for redness.  Respiratory:  Negative for shortness of breath.   Cardiovascular:  Negative for chest pain.  Gastrointestinal:  Positive for blood in stool and rectal pain. Negative for abdominal pain (not currently), diarrhea, nausea and vomiting.  Genitourinary:  Negative for dysuria, frequency, hematuria and urgency.  Musculoskeletal:  Negative for myalgias.  Skin:  Negative for rash.  Neurological:  Negative for syncope and light-headedness.   Physical Exam Updated Vital Signs BP (!) 124/58 (BP Location: Left Arm)   Pulse 79   Temp 98.5 F (36.9 C) (Oral)   Resp 16   Ht 5\' 6"  (1.676 m)   Wt 88.9 kg   SpO2 99%   BMI 31.64 kg/m   Physical Exam Vitals and nursing note reviewed. Exam conducted with  a chaperone present.  Constitutional:      General: She is not in acute distress.    Appearance: She is well-developed.  HENT:     Head: Normocephalic and atraumatic.     Right Ear: External ear normal.     Left Ear: External ear normal.     Nose: Nose normal.  Eyes:     Conjunctiva/sclera: Conjunctivae normal.  Cardiovascular:     Rate and Rhythm: Normal rate and regular rhythm.     Heart sounds: No murmur heard. Pulmonary:     Effort: No respiratory distress.     Breath sounds: No wheezing, rhonchi or rales.  Abdominal:     Palpations: Abdomen is soft.      Tenderness: There is no abdominal tenderness. There is no guarding or rebound.  Genitourinary:    Exam position: Knee-chest position.     Rectum: Guaiac result positive. Tenderness and external hemorrhoid (inflammed, non-thrombosed) present. No mass, anal fissure or internal hemorrhoid.     Comments: No visible blood on finger with DRE.  Musculoskeletal:     Cervical back: Normal range of motion and neck supple.     Right lower leg: No edema.     Left lower leg: No edema.  Skin:    General: Skin is warm and dry.     Findings: No rash.  Neurological:     General: No focal deficit present.     Mental Status: She is alert. Mental status is at baseline.     Motor: No weakness.  Psychiatric:        Mood and Affect: Mood normal.    ED Results / Procedures / Treatments   Labs (all labs ordered are listed, but only abnormal results are displayed) Labs Reviewed  COMPREHENSIVE METABOLIC PANEL - Abnormal; Notable for the following components:      Result Value   Glucose, Bld 175 (*)    Creatinine, Ser 1.08 (*)    GFR, Estimated 52 (*)    All other components within normal limits  CBC WITH DIFFERENTIAL/PLATELET - Abnormal; Notable for the following components:   RBC 3.47 (*)    Hemoglobin 9.7 (*)    HCT 30.2 (*)    All other components within normal limits  POC OCCULT BLOOD, ED - Abnormal; Notable for the following components:   Fecal Occult Bld POSITIVE (*)    All other components within normal limits  PROTIME-INR  TYPE AND SCREEN    EKG None  Radiology No results found.  Procedures Procedures   Medications Ordered in ED Medications - No data to display  ED Course  I have reviewed the triage vital signs and the nursing notes.  Pertinent labs & imaging results that were available during my care of the patient were reviewed by me and considered in my medical decision making (see chart for details).  Patient seen and examined. Work-up initiated. Rectal exam with NT  chaperone.   Vital signs reviewed and are as follows: BP (!) 124/58 (BP Location: Left Arm)   Pulse 79   Temp 98.5 F (36.9 C) (Oral)   Resp 16   Ht 5\' 6"  (1.676 m)   Wt 88.9 kg   SpO2 99%   BMI 31.64 kg/m   Hgb 9.7 but no recent for comparison.  Stool heme positive however no black or bloody stool noted visibly on DRE.  Patient discussed with and seen by Dr. Maryan Rued.   Patient looks well --she is asymptomatic and I do  not suspect large-volume GI bleeding.  Her abdomen is soft and nontender.  Do not feel she requires imaging today.  Patient has reliable PCP follow-up.  I suspect that most likely her bleeding is from an inflamed hemorrhoid.  Cannot entirely rule out diverticular bleed or colitis --however felt much more unlikely.  Plan for discharged home with Anusol suppository, close follow-up with PCP early next week with close return instructions.  The patient was urged to return to the Emergency Department immediately with worsening of current symptoms, worsening abdominal pain, persistent vomiting, blood noted in stools, fever, or any other concerns. The patient verbalized understanding.      MDM Rules/Calculators/A&P                          Lower GI bleeding suspected given passage of heme positive stool, bright red blood per rectum, small clot, presentation not consistent with a large upper GI bleed.  The following differential diagnoses were considered for this patient's lower GI bleed: *Hemorrhoids - can be painless, blood noted on toilet paper *Anal fissure - tearing pain with defecation, small amount of blood *Colonic polyp - usually painless *Proctitis - usually associated with passage of mucus and diarrhea *IBD - presents with crampy abdominal pain, tenesmus, fever *Infectious diarrhea - usually abrupt onset* *Diverticulosis - large volume of painless bleeding, >40yo *Mesenteric ischemia - >50yo, underlying cardiovascular disease, pain out of proportion *Colon  cancer *Arteriovenous malformation  None of the following red flags identified or suspected: *Abnormal vital signs *Symptoms suggestive of malignancy such as constitutional symptoms (fever, weight loss), anemia, or change in frequency, caliber or consistency of stools * Family history of colon cancer  The patient appears reasonably screened and/or stabilized for discharge and I doubt any other medical condition or other emergency medical conditions requiring further screening, evaluation, or treatment in the ED at this time prior to discharge.  Follow-up: Close PCP f/u for hemoglobin recheck. May need outpatient GI work-up.    Final Clinical Impression(s) / ED Diagnoses Final diagnoses:  Rectal bleeding  Hemorrhoids, unspecified hemorrhoid type    Rx / DC Orders ED Discharge Orders          Ordered    hydrocortisone (ANUSOL-HC) 25 MG suppository  2 times daily        02/28/21 2238             Carlisle Cater, PA-C 02/28/21 2311    Blanchie Dessert, MD 03/01/21 1607

## 2021-02-28 NOTE — Discharge Instructions (Addendum)
Please read and follow all provided instructions.  Your diagnoses today include:  1. Rectal bleeding   2. Hemorrhoids, unspecified hemorrhoid type     Tests performed today include: Blood cell counts and platelets - mild anemia (hemoglobin 9.7) Kidney and liver function tests Vital signs. See below for your results today.   Medications prescribed:  Anusol suppository  Take any prescribed medications only as directed.  Home care instructions:  Follow any educational materials contained in this packet.  Follow-up instructions: Please follow-up with your primary care provider in the next 2 days for further evaluation of your symptoms.  It is important to have your symptoms rechecked and your hemoglobin rechecked.  Return instructions:  SEEK IMMEDIATE MEDICAL ATTENTION IF: Develop persistent or severe abdominal pain A temperature above 101F develops  Repeated vomiting occurs (multiple episodes)  The pain becomes localized to portions of the abdomen. The right side could possibly be appendicitis. In an adult, the left lower portion of the abdomen could be colitis or diverticulitis.  A large amount of blood is being passed in stools or vomit (bright red or black tarry stools)  You develop chest pain, difficulty breathing, dizziness or fainting, or become confused, poorly responsive, or inconsolable (young children) If you have any other emergent concerns regarding your health  Your vital signs today were: BP (!) 124/58 (BP Location: Left Arm)   Pulse 79   Temp 98.5 F (36.9 C) (Oral)   Resp 16   Ht 5\' 6"  (1.676 m)   Wt 88.9 kg   SpO2 99%   BMI 31.64 kg/m  If your blood pressure (bp) was elevated above 135/85 this visit, please have this repeated by your doctor within one month. --------------

## 2021-02-28 NOTE — ED Triage Notes (Signed)
Pt came in with one episode of blood in stool. States there was a small clot in it so it worried her. Pt is not on any blood thinners and takes only vitamins. No dizziness or syncope noted

## 2021-08-01 ENCOUNTER — Encounter (HOSPITAL_COMMUNITY): Payer: Self-pay

## 2021-08-01 ENCOUNTER — Emergency Department (HOSPITAL_COMMUNITY): Payer: Medicare PPO

## 2021-08-01 ENCOUNTER — Inpatient Hospital Stay (HOSPITAL_COMMUNITY)
Admission: EM | Admit: 2021-08-01 | Discharge: 2021-08-10 | DRG: 330 | Disposition: A | Discharge status: Home / Self Care | Payer: Medicare PPO | Attending: Family Medicine | Admitting: Family Medicine

## 2021-08-01 DIAGNOSIS — R8290 Unspecified abnormal findings in urine: Secondary | ICD-10-CM

## 2021-08-01 DIAGNOSIS — Z20822 Contact with and (suspected) exposure to covid-19: Secondary | ICD-10-CM | POA: Diagnosis present

## 2021-08-01 DIAGNOSIS — E119 Type 2 diabetes mellitus without complications: Secondary | ICD-10-CM

## 2021-08-01 DIAGNOSIS — Z880 Allergy status to penicillin: Secondary | ICD-10-CM

## 2021-08-01 DIAGNOSIS — D62 Acute posthemorrhagic anemia: Secondary | ICD-10-CM | POA: Diagnosis not present

## 2021-08-01 DIAGNOSIS — K649 Unspecified hemorrhoids: Secondary | ICD-10-CM | POA: Diagnosis present

## 2021-08-01 DIAGNOSIS — C180 Malignant neoplasm of cecum: Secondary | ICD-10-CM | POA: Diagnosis not present

## 2021-08-01 DIAGNOSIS — I4891 Unspecified atrial fibrillation: Secondary | ICD-10-CM | POA: Diagnosis present

## 2021-08-01 DIAGNOSIS — K625 Hemorrhage of anus and rectum: Secondary | ICD-10-CM

## 2021-08-01 DIAGNOSIS — K219 Gastro-esophageal reflux disease without esophagitis: Secondary | ICD-10-CM | POA: Diagnosis present

## 2021-08-01 DIAGNOSIS — D649 Anemia, unspecified: Secondary | ICD-10-CM | POA: Diagnosis not present

## 2021-08-01 DIAGNOSIS — Z79899 Other long term (current) drug therapy: Secondary | ICD-10-CM

## 2021-08-01 DIAGNOSIS — D509 Iron deficiency anemia, unspecified: Secondary | ICD-10-CM | POA: Diagnosis present

## 2021-08-01 DIAGNOSIS — K449 Diaphragmatic hernia without obstruction or gangrene: Secondary | ICD-10-CM | POA: Diagnosis present

## 2021-08-01 DIAGNOSIS — R1084 Generalized abdominal pain: Secondary | ICD-10-CM

## 2021-08-01 DIAGNOSIS — K6389 Other specified diseases of intestine: Secondary | ICD-10-CM

## 2021-08-01 DIAGNOSIS — Z91041 Radiographic dye allergy status: Secondary | ICD-10-CM

## 2021-08-01 DIAGNOSIS — K922 Gastrointestinal hemorrhage, unspecified: Secondary | ICD-10-CM

## 2021-08-01 DIAGNOSIS — K921 Melena: Secondary | ICD-10-CM | POA: Diagnosis present

## 2021-08-01 LAB — CBC WITH DIFFERENTIAL/PLATELET
Abs Immature Granulocytes: 0.01 10*3/uL (ref 0.00–0.07)
Basophils Absolute: 0 10*3/uL (ref 0.0–0.1)
Basophils Relative: 1 %
Eosinophils Absolute: 0.2 10*3/uL (ref 0.0–0.5)
Eosinophils Relative: 2 %
HCT: 25.5 % — ABNORMAL LOW (ref 36.0–46.0)
Hemoglobin: 7.4 g/dL — ABNORMAL LOW (ref 12.0–15.0)
Immature Granulocytes: 0 %
Lymphocytes Relative: 31 %
Lymphs Abs: 1.9 10*3/uL (ref 0.7–4.0)
MCH: 21.3 pg — ABNORMAL LOW (ref 26.0–34.0)
MCHC: 29 g/dL — ABNORMAL LOW (ref 30.0–36.0)
MCV: 73.3 fL — ABNORMAL LOW (ref 80.0–100.0)
Monocytes Absolute: 0.6 10*3/uL (ref 0.1–1.0)
Monocytes Relative: 9 %
Neutro Abs: 3.6 10*3/uL (ref 1.7–7.7)
Neutrophils Relative %: 57 %
Platelets: 408 10*3/uL — ABNORMAL HIGH (ref 150–400)
RBC: 3.48 MIL/uL — ABNORMAL LOW (ref 3.87–5.11)
RDW: 17 % — ABNORMAL HIGH (ref 11.5–15.5)
WBC: 6.4 10*3/uL (ref 4.0–10.5)
nRBC: 0 % (ref 0.0–0.2)

## 2021-08-01 LAB — COMPREHENSIVE METABOLIC PANEL
ALT: 12 U/L (ref 0–44)
AST: 17 U/L (ref 15–41)
Albumin: 3.6 g/dL (ref 3.5–5.0)
Alkaline Phosphatase: 76 U/L (ref 38–126)
Anion gap: 7 (ref 5–15)
BUN: 11 mg/dL (ref 8–23)
CO2: 23 mmol/L (ref 22–32)
Calcium: 9.1 mg/dL (ref 8.9–10.3)
Chloride: 106 mmol/L (ref 98–111)
Creatinine, Ser: 0.73 mg/dL (ref 0.44–1.00)
GFR, Estimated: >60 mL/min (ref >60)
Glucose, Bld: 217 mg/dL — ABNORMAL HIGH (ref 70–99)
Potassium: 4.2 mmol/L (ref 3.5–5.1)
Sodium: 136 mmol/L (ref 135–145)
Total Bilirubin: 0.5 mg/dL (ref 0.3–1.2)
Total Protein: 7.4 g/dL (ref 6.5–8.1)

## 2021-08-01 LAB — URINALYSIS, ROUTINE W REFLEX MICROSCOPIC
Bilirubin Urine: NEGATIVE
Glucose, UA: 50 mg/dL — AB
Hgb urine dipstick: NEGATIVE
Ketones, ur: NEGATIVE mg/dL
Nitrite: NEGATIVE
Protein, ur: NEGATIVE mg/dL
Specific Gravity, Urine: 1.024 (ref 1.005–1.030)
pH: 5 (ref 5.0–8.0)

## 2021-08-01 LAB — RESP PANEL BY RT-PCR (FLU A&B, COVID) ARPGX2
Influenza A by PCR: NEGATIVE
Influenza B by PCR: NEGATIVE
SARS Coronavirus 2 by RT PCR: NEGATIVE

## 2021-08-01 LAB — TROPONIN I (HIGH SENSITIVITY): Troponin I (High Sensitivity): 3 ng/L (ref <18)

## 2021-08-01 LAB — POC OCCULT BLOOD, ED: Fecal Occult Bld: POSITIVE — AB

## 2021-08-01 LAB — BRAIN NATRIURETIC PEPTIDE: B Natriuretic Peptide: 52.1 pg/mL (ref 0.0–100.0)

## 2021-08-01 LAB — PREPARE RBC (CROSSMATCH)

## 2021-08-01 LAB — LIPASE, BLOOD: Lipase: 32 U/L (ref 11–51)

## 2021-08-01 MED ORDER — ACETAMINOPHEN 325 MG PO TABS: 650.0000 mg | ORAL_TABLET | Freq: Four times a day (QID) | ORAL | Status: DC | PRN

## 2021-08-01 MED ORDER — PANTOPRAZOLE SODIUM 40 MG IV SOLR
40.0000 mg | Freq: Two times a day (BID) | INTRAVENOUS | Status: DC
  Administered 2021-08-01 – 2021-08-05 (×8): 40 mg via INTRAVENOUS
  Filled 2021-08-01 (×7): qty 40

## 2021-08-01 MED ORDER — ACETAMINOPHEN 650 MG RE SUPP: 650.0000 mg | Freq: Four times a day (QID) | RECTAL | Status: DC | PRN

## 2021-08-01 MED ORDER — SODIUM CHLORIDE 0.9 % IV SOLN
10.0000 mL/h | Freq: Once | INTRAVENOUS | Status: AC
  Administered 2021-08-01: 10 mL/h via INTRAVENOUS

## 2021-08-01 NOTE — ED Triage Notes (Addendum)
Pt reports right sided abdominal pain and nausea for 6-8 weeks.   Reports she also noticed a slight amount of blood in her stool yesterday

## 2021-08-01 NOTE — H&P (Signed)
History and Physical    [name] OEV:[id number] DOB: 1941/06/01 DOA: 08/01/2021  PCP: Simona Huh, NP Patient coming from: Home  Chief Complaint: Abdominal pain, bloody stools  HPI: [name] is a 80 y.o. female with a past medical history of hemorrhoids presented to the ED for evaluation of abdominal pain for several months, nausea, vomiting, and one episode of bloody stools yesterday.  Hemodynamically stable.  Labs showing WBC 6.4, hemoglobin 7.4 (was 9.7 in June 2022), MCV 73.3, platelet count 408k.  Sodium 136, potassium 4.2, chloride 106, bicarb 23, BUN 11, creatinine 0.7, glucose 217.  Lipase and LFTs normal.  UA with negative nitrite, moderate amount of leukocytes, 6-10 WBCs, and few bacteria.  High-sensitivity troponin negative.  BNP normal.  COVID and influenza PCR negative.  Brown stool noted on rectal exam but FOBT positive.  CT abdomen pelvis negative for acute finding.  1 unit PRBCs ordered.  Patient reports 7-month history of right lower quadrant abdominal pain and generalized weakness in the morning after she wakes up.  Also episodes of nausea but no vomiting.  Symptoms usually improve after she eats something.  Also had a few episodes of dark stools.  Reports lightheadedness in the morning and also dyspnea with exertion.  Yesterday she ate peanut butter crackers and experienced abdominal pain again.  Her stool was dark with some blood mixed in.  Patient states she had bloody stools 3 months ago which she thought were due to her eating a lot of popcorn.  Her PCP recommended colonoscopy but it has not been done yet.  Never had endoscopy done.  Denies NSAID or alcohol use.  Denies cough or chest pain.  Denies dysuria or urinary frequency/urgency.  Review of Systems:  All systems reviewed and apart from history of presenting illness, are negative.  History reviewed. No pertinent past medical history.  History reviewed. No pertinent surgical history.    reports that she has never smoked. She has never used smokeless tobacco. She reports that she does not drink alcohol and does not use drugs.  Allergies  Allergen Reactions   Iodinated Diagnostic Agents Other (See Comments)    Bag of oil formed, thought it was a goiter   Penicillins Hives    History reviewed. No pertinent family history.  Prior to Admission medications   Medication Sig Start Date End Date Taking? Authorizing Provider  cholecalciferol (VITAMIN D3) 25 MCG (1000 UNIT) tablet Take 1,000 Units by mouth every other day.    [provider]  hydrocortisone (ANUSOL-HC) 25 MG suppository Place 1 suppository (25 mg total) rectally 2 (two) times daily. 02/28/21   Carlisle Cater, PA-C  lidocaine (LIDODERM) 5 % Place 1 patch onto the skin daily. Remove & Discard patch within 12 hours or as directed by MD Patient not taking: Reported on 02/28/2021 06/21/20   Caccavale, Sophia, PA-C  Multiple Vitamin (MULTIVITAMIN ADULT) TABS Take 2 tablets by mouth in the morning and at bedtime.    [provider]    Physical Exam: Vitals:   08/01/21 2041 08/01/21 2116 08/01/21 2130 08/01/21 2200  BP:  128/68 131/69 129/61  Pulse: 65 67 68 69  Resp:  16    Temp: 98.3 F (36.8 C) 97.9 F (36.6 C)    TempSrc: Oral Oral    SpO2: 100% 100% 100% 100%    Physical Exam Constitutional:      General: She is not in acute distress. HENT:     Head: Normocephalic and atraumatic.  Eyes:  Extraocular Movements: Extraocular movements intact.  Cardiovascular:     Rate and Rhythm: Normal rate and regular rhythm.     Pulses: Normal pulses.  Pulmonary:     Effort: Pulmonary effort is normal. No respiratory distress.     Breath sounds: Normal breath sounds. No wheezing or rales.  Abdominal:     General: Bowel sounds are normal. There is no distension.     Palpations: Abdomen is soft.     Tenderness: There is no abdominal tenderness. There is no guarding or rebound.  Musculoskeletal:         General: No swelling or tenderness.     Cervical back: Normal range of motion and neck supple.  Skin:    General: Skin is warm and dry.  Neurological:     General: No focal deficit present.     Mental Status: She is alert and oriented to person, place, and time.     Labs on Admission: I have personally reviewed following labs and imaging studies  CBC: Recent Labs  Lab 08/01/21 1337  WBC 6.4  NEUTROABS 3.6  HGB 7.4*  HCT 25.5*  MCV 73.3*  PLT 967*   Basic Metabolic Panel: Recent Labs  Lab 08/01/21 1337  NA 136  K 4.2  CL 106  CO2 23  GLUCOSE 217*  BUN 11  CREATININE 0.73  CALCIUM 9.1   GFR: CrCl cannot be calculated (Unknown ideal weight.). Liver Function Tests: Recent Labs  Lab 08/01/21 1337  AST 17  ALT 12  ALKPHOS 76  BILITOT 0.5  PROT 7.4  ALBUMIN 3.6   Recent Labs  Lab 08/01/21 1337  LIPASE 32   No results for input(s): AMMONIA in the last 168 hours. Coagulation Profile: No results for input(s): INR, PROTIME in the last 168 hours. Cardiac Enzymes: No results for input(s): CKTOTAL, CKMB, CKMBINDEX, TROPONINI in the last 168 hours. BNP (last 3 results) No results for input(s): PROBNP in the last 8760 hours. HbA1C: No results for input(s): HGBA1C in the last 72 hours. CBG: No results for input(s): GLUCAP in the last 168 hours. Lipid Profile: No results for input(s): CHOL, HDL, LDLCALC, TRIG, CHOLHDL, LDLDIRECT in the last 72 hours. Thyroid Function Tests: No results for input(s): TSH, T4TOTAL, FREET4, T3FREE, THYROIDAB in the last 72 hours. Anemia Panel: No results for input(s): VITAMINB12, FOLATE, FERRITIN, TIBC, IRON, RETICCTPCT in the last 72 hours. Urine analysis:    Component Value Date/Time   COLORURINE YELLOW 08/01/2021 1824   APPEARANCEUR HAZY (A) 08/01/2021 1824   LABSPEC 1.024 08/01/2021 1824   PHURINE 5.0 08/01/2021 1824   GLUCOSEU 50 (A) 08/01/2021 1824   HGBUR NEGATIVE 08/01/2021 1824   BILIRUBINUR NEGATIVE 08/01/2021  1824   KETONESUR NEGATIVE 08/01/2021 1824   PROTEINUR NEGATIVE 08/01/2021 1824   UROBILINOGEN 0.2 10/08/2009 1241   NITRITE NEGATIVE 08/01/2021 1824   LEUKOCYTESUR MODERATE (A) 08/01/2021 1824    Radiological Exams on Admission: CT ABDOMEN PELVIS WO CONTRAST  Result Date: 08/01/2021 CLINICAL DATA:  Abdominal pain EXAM: CT ABDOMEN AND PELVIS WITHOUT CONTRAST TECHNIQUE: Multidetector CT imaging of the abdomen and pelvis was performed following the standard protocol without IV contrast. COMPARISON:  CT abdomen and pelvis dated October 08, 2009 FINDINGS: Lower chest: Mitral annular calcifications. Moderate hiatal hernia. No acute abnormality. Hepatobiliary: Low-attenuation lesion of the left hepatic lobe is unchanged compared to 2011 prior and likely a simple cyst. No suspicious liver lesions. Gallbladder is unremarkable. No biliary ductal dilation. Pancreas: Unremarkable. No pancreatic ductal dilatation or surrounding  inflammatory changes. Spleen: Normal in size without focal abnormality. Adrenals/Urinary Tract: Adrenal glands are unremarkable. Kidneys are normal, without renal calculi, focal lesion, or hydronephrosis. Bladder is unremarkable for degree of decompression. Stomach/Bowel: Stomach is within normal limits. Appendix is not definitely visualized. No evidence of bowel wall thickening, distention, or inflammatory changes. Vascular/Lymphatic: Aortic atherosclerosis. No enlarged abdominal or pelvic lymph nodes. Reproductive: No adnexal masses. Other: No abdominal wall hernia or abnormality. No abdominopelvic ascites. Musculoskeletal: No acute or significant osseous findings. IMPRESSION: 1. No acute findings in the abdomen or pelvis. 2. Aortic Atherosclerosis (ICD10-I70.0). Electronically Signed   By: Yetta Glassman M.D.   On: 08/01/2021 19:32    EKG: Independently reviewed.  Sinus rhythm, baseline wander.  No acute ischemic changes.  Assessment/Plan Principal Problem:   Symptomatic  anemia Active Problems:   GI bleed   Abnormal urinalysis   Symptomatic acute blood loss anemia secondary to GI bleed Patient is endorsing abdominal pain x2 months, nausea, episodes of dark and grossly bloody stools, lightheadedness, generalized weakness, and dyspnea on exertion.  Denies NSAID or alcohol use.  Never had colonoscopy or endoscopy done.  Hemodynamically stable.  CT abdomen pelvis without contrast negative for acute finding.  Has brown stool on rectal exam but FOBT positive.  Hemoglobin down to 7.4, was 9.7 in June 2022.  MCV 73.3. -Type and screen, 1 unit PRBCs ordered in the ED as she is symptomatic from her anemia.  Follow posttransfusion H&H.  Start IV Protonix 40 mg every 12 hours.  Keep n.p.o. after midnight.  I have sent a message to Dr. Watt Climes on-call for GI tonight requesting consultation in the morning.  Abnormal urinalysis UA with negative nitrite, moderate amount of leukocytes, 6-10 WBCs, and few bacteria.  No fever or leukocytosis.  Patient is not endorsing any UTI symptoms.   -Urine culture added on  DVT prophylaxis: SCDs Code Status: Patient wishes to be full code. Family Communication: No family available at this time. Disposition Plan: Status is: Observation  The patient remains OBS appropriate and will d/c before 2 midnights.  Level of care: Level of care: Med-Surg  The medical decision making on this patient was of high complexity and the patient is at high risk for clinical deterioration, therefore this is a level 3 visit.  Shela Leff MD [facility]  If 7PM-7AM, please contact night-coverage [email or web address]  08/01/2021, 10:36 PM

## 2021-08-01 NOTE — ED Provider Notes (Signed)
[facility] DEPT Provider Note   CSN: [id number] Arrival date & time: 08/01/21  1206     History Chief Complaint  Patient presents with  . Abdominal Pain  . Fall    [name] is a 80 y.o. female.  The history is provided by the patient and medical records. No language interpreter was used.  Abdominal Pain Pain location:  Generalized Pain quality: aching   Pain radiates to:  Does not radiate Pain severity:  Moderate Onset quality:  Gradual Progression:  Waxing and waning Chronicity:  New Relieved by:  Nothing Worsened by:  Nothing Associated symptoms: fatigue, nausea, shortness of breath and vomiting   Associated symptoms: no chest pain, no chills, no constipation, no cough, no diarrhea, no dysuria and no fever   Fall Associated symptoms include abdominal pain and shortness of breath. Pertinent negatives include no chest pain and no headaches.      History reviewed. No pertinent past medical history.  There are no problems to display for this patient.   History reviewed. No pertinent surgical history.   OB History   No obstetric history on file.     No family history on file.  Social History   Tobacco Use  . Smoking status: Never  . Smokeless tobacco: Never  Substance Use Topics  . Alcohol use: No  . Drug use: No    Home Medications Prior to Admission medications   Medication Sig Start Date End Date Taking? Authorizing Provider  cholecalciferol (VITAMIN D3) 25 MCG (1000 UNIT) tablet Take 1,000 Units by mouth every other day.    [provider]  hydrocortisone (ANUSOL-HC) 25 MG suppository Place 1 suppository (25 mg total) rectally 2 (two) times daily. 02/28/21   Carlisle Cater, PA-C  lidocaine (LIDODERM) 5 % Place 1 patch onto the skin daily. Remove & Discard patch within 12 hours or as directed by MD Patient not taking: Reported on 02/28/2021 06/21/20   Caccavale, Sophia, PA-C  Multiple Vitamin  (MULTIVITAMIN ADULT) TABS Take 2 tablets by mouth in the morning and at bedtime.    [provider]    Allergies    Iodinated diagnostic agents and Penicillins  Review of Systems   Review of Systems  Constitutional:  Positive for fatigue. Negative for chills, diaphoresis and fever.  HENT:  Negative for congestion.   Respiratory:  Positive for shortness of breath. Negative for cough, chest tightness and wheezing.   Cardiovascular:  Negative for chest pain and palpitations.  Gastrointestinal:  Positive for abdominal pain, anal bleeding, nausea and vomiting. Negative for constipation, diarrhea and rectal pain.  Genitourinary:  Negative for dysuria.  Musculoskeletal:  Negative for back pain.  Neurological:  Negative for headaches.  Psychiatric/Behavioral:  Negative for agitation.   All other systems reviewed and are negative.  Physical Exam Updated Vital Signs BP 139/66 (BP Location: Left Arm)   Pulse 77   Temp 98.4 F (36.9 C) (Oral)   Resp 18   SpO2 100%   Physical Exam Vitals and nursing note reviewed.  Constitutional:      General: She is not in acute distress.    Appearance: She is well-developed. She is not ill-appearing, toxic-appearing or diaphoretic.  HENT:     Head: Normocephalic and atraumatic.  Eyes:     Conjunctiva/sclera: Conjunctivae normal.  Cardiovascular:     Rate and Rhythm: Normal rate and regular rhythm.     Heart sounds: No murmur heard. Pulmonary:     Effort: Pulmonary effort  is normal. No respiratory distress.     Breath sounds: Normal breath sounds.  Abdominal:     General: Abdomen is flat. Bowel sounds are normal. There is no distension.     Palpations: Abdomen is soft.     Tenderness: There is no abdominal tenderness.  Musculoskeletal:     Cervical back: Neck supple.  Skin:    General: Skin is warm and dry.     Capillary Refill: Capillary refill takes less than 2 seconds.     Findings: No rash.  Neurological:     General: No focal  deficit present.     Mental Status: She is alert.  Psychiatric:        Mood and Affect: Mood normal.    ED Results / Procedures / Treatments   Labs (all labs ordered are listed, but only abnormal results are displayed) Labs Reviewed  COMPREHENSIVE METABOLIC PANEL - Abnormal; Notable for the following components:      Result Value   Glucose, Bld 217 (*)    All other components within normal limits  CBC WITH DIFFERENTIAL/PLATELET - Abnormal; Notable for the following components:   RBC 3.48 (*)    Hemoglobin 7.4 (*)    HCT 25.5 (*)    MCV 73.3 (*)    MCH 21.3 (*)    MCHC 29.0 (*)    RDW 17.0 (*)    Platelets 408 (*)    All other components within normal limits  URINALYSIS, ROUTINE W REFLEX MICROSCOPIC - Abnormal; Notable for the following components:   APPearance HAZY (*)    Glucose, UA 50 (*)    Leukocytes,Ua MODERATE (*)    Bacteria, UA FEW (*)    All other components within normal limits  POC OCCULT BLOOD, ED - Abnormal; Notable for the following components:   Fecal Occult Bld POSITIVE (*)    All other components within normal limits  RESP PANEL BY RT-PCR (FLU A&B, COVID) [id number]  URINE CULTURE  LIPASE, BLOOD  BRAIN NATRIURETIC PEPTIDE  HEMOGLOBIN AND HEMATOCRIT, BLOOD  PREPARE RBC (CROSSMATCH)  TYPE AND SCREEN  TROPONIN I (HIGH SENSITIVITY)    EKG None  Radiology CT ABDOMEN PELVIS WO CONTRAST  Result Date: 08/01/2021 CLINICAL DATA:  Abdominal pain EXAM: CT ABDOMEN AND PELVIS WITHOUT CONTRAST TECHNIQUE: Multidetector CT imaging of the abdomen and pelvis was performed following the standard protocol without IV contrast. COMPARISON:  CT abdomen and pelvis dated October 08, 2009 FINDINGS: Lower chest: Mitral annular calcifications. Moderate hiatal hernia. No acute abnormality. Hepatobiliary: Low-attenuation lesion of the left hepatic lobe is unchanged compared to 2011 prior and likely a simple cyst. No suspicious liver lesions. Gallbladder is unremarkable. No biliary  ductal dilation. Pancreas: Unremarkable. No pancreatic ductal dilatation or surrounding inflammatory changes. Spleen: Normal in size without focal abnormality. Adrenals/Urinary Tract: Adrenal glands are unremarkable. Kidneys are normal, without renal calculi, focal lesion, or hydronephrosis. Bladder is unremarkable for degree of decompression. Stomach/Bowel: Stomach is within normal limits. Appendix is not definitely visualized. No evidence of bowel wall thickening, distention, or inflammatory changes. Vascular/Lymphatic: Aortic atherosclerosis. No enlarged abdominal or pelvic lymph nodes. Reproductive: No adnexal masses. Other: No abdominal wall hernia or abnormality. No abdominopelvic ascites. Musculoskeletal: No acute or significant osseous findings. IMPRESSION: 1. No acute findings in the abdomen or pelvis. 2. Aortic Atherosclerosis (ICD10-I70.0). Electronically Signed   By: Yetta Glassman M.D.   On: 08/01/2021 19:32    Procedures Procedures   Medications Ordered in ED Medications  acetaminophen (TYLENOL) tablet 650 mg (  has no administration in time range)    Or  acetaminophen (TYLENOL) suppository 650 mg (has no administration in time range)  pantoprazole (PROTONIX) injection 40 mg (40 mg Intravenous Given 08/01/21 2334)  0.9 %  sodium chloride infusion (0 mL/hr Intravenous Stopped 08/01/21 2334)    ED Course  I have reviewed the triage vital signs and the nursing notes.  Pertinent labs & imaging results that were available during my care of the patient were reviewed by me and considered in my medical decision making (see chart for details).    MDM Rules/Calculators/A&P                           [name] is a 80 y.o. female with a past medical history significant for previous GI bleed who presents with abdominal pain, rectal bleeding, fatigue, and exertional shortness of breath.  According to patient, she has had abdominal pain on and off for the last few weeks and has had  some dark stools.  She reports yesterday she had a bright bloody stool and an episode of nausea and vomiting without blood in the emesis.  She reports the pain is in her central abdomen and is aching.  She thought that something was growing in her abdomen.  She reports no new urinary changes, chest pain but does report exertional shortness of breath and near syncope with fatigue.  On exam, abdomen is tender.  Bowel sounds appreciated.  Fecal occult was positive.  External hemorrhoid was nontender and nonbloody.  Exam otherwise unremarkable.  Lungs clear and chest nontender.  CT scan was obtained which did not show any acute abnormality.   Patient was found to have hemoglobin in the 7 range down from nines previously.  Due to the exertional shortness of breath and fatigue with this new GI bleeding, I am concerned about symptomatic anemia.  As a CT scan was reassuring, I do feel she needs to be seen by GI tomorrow but will need blood and admission for the symptomatic anemia.  Patient was given blood and was admitted for further management.   Final Clinical Impression(s) / ED Diagnoses Final diagnoses:  Symptomatic anemia  Generalized abdominal pain  Rectal bleeding    Clinical Impression: 1. Symptomatic anemia   2. Generalized abdominal pain   3. Rectal bleeding     Disposition: Admit  This note was prepared with assistance of Dragon voice recognition software. Occasional wrong-word or sound-a-like substitutions may have occurred due to the inherent limitations of voice recognition software.     Patrice Moates, Gwenyth Allegra, MD 08/02/21 [phone]

## 2021-08-01 NOTE — ED Provider Notes (Signed)
Emergency Medicine Provider Triage Evaluation Note  [name] , a 80 y.o. female  was evaluated in triage.  Pt complains of abdominal pain.  She states that the symptoms began 5 to 6 months ago after she fell.  However, states that in the last few weeks she has developed nausea and periumbilical/RLQ abdominal pain.  She states "I feel like something is growing inside me."  Also endorses 1 episode of bloody stools yesterday and 1 episode of vomiting.  Review of Systems  Positive: Abdominal pain Negative: Fevers, chills, chest pain, shortness of breath  Physical Exam  BP (!) 128/53 (BP Location: Left Arm)   Pulse 82   Temp 98.4 F (36.9 C) (Oral)   Resp 16   SpO2 98%  Gen:   Awake, no distress   Resp:  Normal effort  MSK:   Moves extremities without difficulty  Other:   Medical Decision Making  Medically screening exam initiated at 12:43 PM.  Appropriate orders placed.  Cordelia Pen was informed that the remainder of the evaluation will be completed by another provider, this initial triage assessment does not replace that evaluation, and the importance of remaining in the ED until their evaluation is complete.     Nestor Lewandowsky 08/01/21 1248    Tegeler, Gwenyth Allegra, MD 08/01/21 [phone]

## 2021-08-02 ENCOUNTER — Encounter (HOSPITAL_COMMUNITY): Payer: Self-pay | Admitting: Internal Medicine

## 2021-08-02 ENCOUNTER — Other Ambulatory Visit: Payer: Self-pay

## 2021-08-02 DIAGNOSIS — D649 Anemia, unspecified: Secondary | ICD-10-CM

## 2021-08-02 LAB — HEMOGLOBIN AND HEMATOCRIT, BLOOD
HCT: 26.6 % — ABNORMAL LOW (ref 36.0–46.0)
Hemoglobin: 8.1 g/dL — ABNORMAL LOW (ref 12.0–15.0)

## 2021-08-02 MED ORDER — SODIUM CHLORIDE 0.9 % IV SOLN: INTRAVENOUS | Status: DC

## 2021-08-02 MED ORDER — PEG 3350-KCL-NA BICARB-NACL 420 G PO SOLR
4000.0000 mL | Freq: Once | ORAL | Status: AC
  Administered 2021-08-02: 4000 mL via ORAL

## 2021-08-02 NOTE — Assessment & Plan Note (Signed)
-   see anemia

## 2021-08-02 NOTE — Assessment & Plan Note (Signed)
-   asymptomatic. UA noted with mod LE, neg nitrite and only 6-10 WBC; holding off on treatment as not indicated

## 2021-08-02 NOTE — Progress Notes (Signed)
Progress Note    [name]   GLO:[id number]  DOB: [birth year]  DOA: 08/01/2021     0 PCP: Simona Huh, NP  Initial CC: Abdominal discomfort, some bloody stools  Hospital Course: [name] is a 80 yo female with PMH afib, pseudophakia both eyes, hemorrhoids who presented to the ER with mild abdominal discomfort for at least the past 3 weeks.  She did have 1 episode of bloody stools just prior to admission.    Patient reports 94-month history of right lower quadrant abdominal pain and generalized weakness in the morning after she wakes up.  Also episodes of nausea but no vomiting.  Symptoms usually improve after she eats something.  Also had a few episodes of dark stools.  Reports lightheadedness in the morning and also dyspnea with exertion.  Patient states she had bloody stools 3 months ago which she thought were due to her eating a lot of popcorn.  Her PCP recommended colonoscopy but it has not been done yet.   She thinks she had a colonoscopy several years ago but cannot recall when or findings.   CT abdomen/pelvis was performed on admission which showed no acute findings. Hemoglobin was noted to be 7.4 g/dL.  No recent labs in epic for comparison.  Last noted labs are from 2011 at which time hemoglobin was 11 to 12 g/dL. She was transfused 1 unit PRBC on admission.   Interval History:  No events overnight.  Family present bedside this morning.  Patient is resting in bed in no distress.  Was asking if she would be able to eat today.  Clear liquids were ordered. Tentative plan is for colonoscopy tomorrow.  Assessment & Plan: * Symptomatic anemia - no recent labs to compare but Hgb on admission 7.4 g/dL (priors ~11-12); mild persistent abd discomfort with intermittent bleeding with afib noted in her history, so differential includes ischemic event vs underlying ulcer (though no predisposing risk facts) vs diverticuler bleed vs hemorrhoids - s/p 1 unit PRBC; Hgb up to 8.1  g/dL  - continue trending - CLD and NPO at [place]; GI aware - continue BID PPI  Abnormal urinalysis - asymptomatic. UA noted with mod LE, neg nitrite and only 6-10 WBC; holding off on treatment as not indicated   GI bleed - see anemia     Old records reviewed in assessment of this patient  Antimicrobials: N/a  DVT prophylaxis: SCD  Code Status:   Code Status: Full Code  Disposition Plan:   Status is: Observation   Objective: Blood pressure (!) 134/56, pulse 64, temperature 98.1 F (36.7 C), resp. rate 15, height 5\' 6"  (1.676 m), weight 85.7 kg, SpO2 100 %.  Examination:  Physical Exam Constitutional:      General: She is not in acute distress.    Appearance: Normal appearance.  HENT:     Mouth/Throat:     Mouth: Mucous membranes are moist.  Eyes:     Extraocular Movements: Extraocular movements intact.  Cardiovascular:     Rate and Rhythm: Normal rate and regular rhythm.  Pulmonary:     Effort: Pulmonary effort is normal.     Breath sounds: Normal breath sounds. No wheezing.  Abdominal:     General: Bowel sounds are normal.     Palpations: Abdomen is soft.     Comments: Mild mid abdominal TTP, no R/G  Musculoskeletal:        General: Normal range of motion.  Skin:    General: Skin is warm  and dry.  Neurological:     General: No focal deficit present.     Mental Status: She is alert.  Psychiatric:        Mood and Affect: Mood normal.        Behavior: Behavior normal.     Consultants:  GI  Procedures:    Data Reviewed: I have personally reviewed labs and imaging studies     LOS: 0 days  Time spent: Greater than 50% of the 35 minute visit was spent in counseling/coordination of care for the patient as laid out in the A&P.   Dwyane Dee, MD [facility] 08/02/2021, 11:16 AM

## 2021-08-02 NOTE — H&P (View-Only) (Signed)
Reason for Consult: Anemia guaiac positivity Referring Physician: Hospital team  [name] is an 80 y.o. female.  HPI: Patient seen and examined and discussed with multiple family members as well as the hospital team in her hospital computer chart was reviewed and she has not had any previous GI work-up and reassurance was given about her CT scan which was reviewed and she was found this summer to be anemic and did have some bright red blood per rectum at that time but was never referred for colonoscopy and she does have 1 aunt with possible colon cancer but no other family members with any GI issues and she denies any aspirin or nonsteroidals or blood thinners and has noted some nausea for about a month in the shower and a weak feeling and periodically will have some reflux and indigestion and lately he has had a little increased upper abdominal discomfort which is helped by Pepto-Bismol and drinking water and she has no other complaints  History reviewed. No pertinent past medical history.  History reviewed. No pertinent surgical history.  History reviewed. No pertinent family history.  Social History:  reports that she has never smoked. She has never used smokeless tobacco. She reports that she does not drink alcohol and does not use drugs.  Allergies:  Allergies  Allergen Reactions   Iodinated Diagnostic Agents Other (See Comments)    Bag of oil formed, thought it was a goiter   Penicillins Hives    Medications: I have reviewed the patient's current medications.  Results for orders placed or performed during the hospital encounter of 08/01/21 (from the past 48 hour(s))  Comprehensive metabolic panel     Status: Abnormal   Collection Time: 08/01/21  1:37 PM  Result Value Ref Range   Sodium 136 135 - 145 mmol/L   Potassium 4.2 3.5 - 5.1 mmol/L   Chloride 106 98 - 111 mmol/L   CO2 23 22 - 32 mmol/L   Glucose, Bld 217 (H) 70 - 99 mg/dL    Comment: Glucose reference range  applies only to samples taken after fasting for at least 8 hours.   BUN 11 8 - 23 mg/dL   Creatinine, Ser 0.73 0.44 - 1.00 mg/dL   Calcium 9.1 8.9 - 10.3 mg/dL   Total Protein 7.4 6.5 - 8.1 g/dL   Albumin 3.6 3.5 - 5.0 g/dL   AST 17 15 - 41 U/L   ALT 12 0 - 44 U/L   Alkaline Phosphatase 76 38 - 126 U/L   Total Bilirubin 0.5 0.3 - 1.2 mg/dL   GFR, Estimated >60 >60 mL/min    Comment: (NOTE) Calculated using the CKD-EPI Creatinine Equation (2021)    Anion gap 7 5 - 15    Comment: Performed at [facility], [place] [address], [place], [place] [address]  Lipase, blood     Status: None   Collection Time: 08/01/21  1:37 PM  Result Value Ref Range   Lipase 32 11 - 51 U/L    Comment: Performed at [facility], [place] [address] [place], [place] [address]  CBC with Diff     Status: Abnormal   Collection Time: 08/01/21  1:37 PM  Result Value Ref Range   WBC 6.4 4.0 - 10.5 K/uL   RBC 3.48 (L) 3.87 - 5.11 MIL/uL   Hemoglobin 7.4 (L) 12.0 - 15.0 g/dL    Comment: Reticulocyte Hemoglobin testing may be clinically indicated, consider ordering this additional test [id number]  HCT 25.5 (L) 36.0 - 46.0 %   MCV 73.3 (L) 80.0 - 100.0 fL   MCH 21.3 (L) 26.0 - 34.0 pg   MCHC 29.0 (L) 30.0 - 36.0 g/dL   RDW 17.0 (H) 11.5 - 15.5 %   Platelets 408 (H) 150 - 400 K/uL   nRBC 0.0 0.0 - 0.2 %   Neutrophils Relative % 57 %   Neutro Abs 3.6 1.7 - 7.7 K/uL   Lymphocytes Relative 31 %   Lymphs Abs 1.9 0.7 - 4.0 K/uL   Monocytes Relative 9 %   Monocytes Absolute 0.6 0.1 - 1.0 K/uL   Eosinophils Relative 2 %   Eosinophils Absolute 0.2 0.0 - 0.5 K/uL   Basophils Relative 1 %   Basophils Absolute 0.0 0.0 - 0.1 K/uL   Immature Granulocytes 0 %   Abs Immature Granulocytes 0.01 0.00 - 0.07 K/uL    Comment: Performed at [facility], [place] [address], [place], [place] [address]  Urinalysis, Routine w reflex microscopic     Status: Abnormal   Collection  Time: 08/01/21  6:24 PM  Result Value Ref Range   Color, Urine YELLOW YELLOW   APPearance HAZY (A) CLEAR   Specific Gravity, Urine 1.024 1.005 - 1.030   pH 5.0 5.0 - 8.0   Glucose, UA 50 (A) NEGATIVE mg/dL   Hgb urine dipstick NEGATIVE NEGATIVE   Bilirubin Urine NEGATIVE NEGATIVE   Ketones, ur NEGATIVE NEGATIVE mg/dL   Protein, ur NEGATIVE NEGATIVE mg/dL   Nitrite NEGATIVE NEGATIVE   Leukocytes,Ua MODERATE (A) NEGATIVE   RBC / HPF 0-5 0 - 5 RBC/hpf   WBC, UA 6-10 0 - 5 WBC/hpf   Bacteria, UA FEW (A) NONE SEEN   Squamous Epithelial / LPF 6-10 0 - 5   Mucus PRESENT     Comment: Performed at [facility], [place] [address] [place], [place] [address]  Prepare RBC (crossmatch)     Status: None   Collection Time: 08/01/21  7:00 PM  Result Value Ref Range   Order Confirmation      ORDER PROCESSED BY BLOOD BANK Performed at [facility], [place] [address] [place], [place] [address]   Type and screen [place]     Status: None (Preliminary result)   Collection Time: 08/01/21  7:00 PM  Result Value Ref Range   ABO/RH(D) A POS    Antibody Screen NEG    Sample Expiration 08/04/2021,2359    Unit Number [id number]    Blood Component Type RED CELLS,LR    Unit division 00    Status of Unit ISSUED    Transfusion Status OK TO TRANSFUSE    Crossmatch Result      Compatible Performed at [facility], [place] [address] [place], [place] [address]   Troponin I (High Sensitivity)     Status: None   Collection Time: 08/01/21  7:00 PM  Result Value Ref Range   Troponin I (High Sensitivity) 3 <18 ng/L    Comment: (NOTE) Elevated high sensitivity troponin I (hsTnI) values and significant  changes across serial measurements may suggest ACS but many other  chronic and acute conditions are known to elevate hsTnI results.  Refer to the "Links" section for chest pain algorithms and additional  guidance. Performed at [facility], [place] [address] [place], [place] [address]   Brain natriuretic peptide     Status: None   Collection Time: 08/01/21  7:00  PM  Result Value Ref Range   B Natriuretic Peptide 52.1 0.0 - 100.0 pg/mL    Comment: Performed at [facility], [place] [address] [place], [place] [address]  Resp Panel by RT-PCR (Flu A&B, Covid) Nasopharyngeal Swab     Status: None   Collection Time: 08/01/21  7:00 PM   Specimen: Nasopharyngeal Swab; Nasopharyngeal(NP) swabs in vial transport medium  Result Value Ref Range   SARS Coronavirus 2 by RT PCR NEGATIVE NEGATIVE    Comment: (NOTE) SARS-CoV-2 target nucleic acids are NOT DETECTED.  The SARS-CoV-2 RNA is generally detectable in upper respiratory specimens during the acute phase of infection. The lowest concentration of SARS-CoV-2 viral copies this assay can detect is 138 copies/mL. A negative result does not preclude SARS-Cov-2 infection and should not be used as the sole basis for treatment or other patient management decisions. A negative result may occur with  improper specimen collection/handling, submission of specimen other than nasopharyngeal swab, presence of viral mutation(s) within the areas targeted by this assay, and inadequate number of viral copies(<138 copies/mL). A negative result must be combined with clinical observations, patient history, and epidemiological information. The expected result is Negative.  Fact Sheet for Patients:  EntrepreneurPulse.com.au  Fact Sheet for Healthcare Providers:  IncredibleEmployment.be  This test is no t yet approved or cleared by the [place] FDA and  has been authorized for detection and/or diagnosis of SARS-CoV-2 by FDA under an Emergency Use Authorization (EUA). This EUA will remain  in effect (meaning this test can be used) for the duration of the COVID-19 declaration under Section 564(b)(1) of the Act,  21 U.S.C.section 360bbb-3(b)(1), unless the authorization is terminated  or revoked sooner.       Influenza A by PCR NEGATIVE NEGATIVE   Influenza B by PCR NEGATIVE NEGATIVE    Comment: (NOTE) The Xpert Xpress SARS-CoV-2/FLU/RSV plus assay is intended as an aid in the diagnosis of influenza from Nasopharyngeal swab specimens and should not be used as a sole basis for treatment. Nasal washings and aspirates are unacceptable for Xpert Xpress SARS-CoV-2/FLU/RSV testing.  Fact Sheet for Patients: EntrepreneurPulse.com.au  Fact Sheet for Healthcare Providers: IncredibleEmployment.be  This test is not yet approved or cleared by the [place] FDA and has been authorized for detection and/or diagnosis of SARS-CoV-2 by FDA under an Emergency Use Authorization (EUA). This EUA will remain in effect (meaning this test can be used) for the duration of the COVID-19 declaration under Section 564(b)(1) of the Act, 21 U.S.C. section 360bbb-3(b)(1), unless the authorization is terminated or revoked.  Performed at [facility], [place] [address] [place], [place] [address]   POC occult blood, ED     Status: Abnormal   Collection Time: 08/01/21  9:44 PM  Result Value Ref Range   Fecal Occult Bld POSITIVE (A) NEGATIVE  Hemoglobin and hematocrit, blood     Status: Abnormal   Collection Time: 08/02/21  4:40 AM  Result Value Ref Range   Hemoglobin 8.1 (L) 12.0 - 15.0 g/dL   HCT 26.6 (L) 36.0 - 46.0 %    Comment: Performed at [facility], [place] [address], [place],  [address]    CT ABDOMEN PELVIS WO CONTRAST  Result Date: 08/01/2021 CLINICAL DATA:  Abdominal pain EXAM: CT ABDOMEN AND PELVIS WITHOUT CONTRAST TECHNIQUE: Multidetector CT imaging of the abdomen and pelvis was performed following the standard protocol without IV contrast. COMPARISON:  CT abdomen and pelvis dated October 08, 2009 FINDINGS: Lower  chest:  Mitral annular calcifications. Moderate hiatal hernia. No acute abnormality. Hepatobiliary: Low-attenuation lesion of the left hepatic lobe is unchanged compared to 2011 prior and likely a simple cyst. No suspicious liver lesions. Gallbladder is unremarkable. No biliary ductal dilation. Pancreas: Unremarkable. No pancreatic ductal dilatation or surrounding inflammatory changes. Spleen: Normal in size without focal abnormality. Adrenals/Urinary Tract: Adrenal glands are unremarkable. Kidneys are normal, without renal calculi, focal lesion, or hydronephrosis. Bladder is unremarkable for degree of decompression. Stomach/Bowel: Stomach is within normal limits. Appendix is not definitely visualized. No evidence of bowel wall thickening, distention, or inflammatory changes. Vascular/Lymphatic: Aortic atherosclerosis. No enlarged abdominal or pelvic lymph nodes. Reproductive: No adnexal masses. Other: No abdominal wall hernia or abnormality. No abdominopelvic ascites. Musculoskeletal: No acute or significant osseous findings. IMPRESSION: 1. No acute findings in the abdomen or pelvis. 2. Aortic Atherosclerosis (ICD10-I70.0). Electronically Signed   By: Yetta Glassman M.D.   On: 08/01/2021 19:32    ROS negative except above Blood pressure (!) 134/56, pulse 64, temperature 98.1 F (36.7 C), resp. rate 15, height 5\' 6"  (1.676 m), weight 85.7 kg, SpO2 100 %. Physical Exam vital signs stable afebrile no acute distress exam pertinent for abdomen being soft nontender labs reviewed CT reviewed  Assessment/Plan: Anemia guaiac positivity and patient overdue for colonic screening Plan: The risk benefits methods of colonoscopy and endoscopy was discussed with the patient and although she could go home and have these as an outpatient and set that up with her primary care physician I have offered her to do it tomorrow by my partner Dr. Alessandra Bevels and she agrees and we answered all of her and the family's questions and talked  about screening protocols as well  Royston Bekele E 08/02/2021, 12:25 PM

## 2021-08-02 NOTE — Assessment & Plan Note (Addendum)
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  This is her presumed source - continue BID PPI -1 unit PRBC ordered on 08/04/2021 per surgery recommendation for upcoming surgery

## 2021-08-02 NOTE — ED Notes (Signed)
ED TO INPATIENT HANDOFF REPORT  Name/Age/Gender [name] 80 y.o. female  Code Status    Code Status Orders  (From admission, onward)           Start     Ordered   08/01/21 2238  Full code  Continuous        08/01/21 2238           Code Status History     This patient has a current code status but no historical code status.       Home/SNF/Other Home  Chief Complaint Symptomatic anemia [D64.9]  Level of Care/Admitting Diagnosis ED Disposition     ED Disposition  Admit   Condition  --   Comment  Hospital Area: [facility] [[id number]]  Level of Care: Med-Surg [16]  May place patient in observation at [facility] or [facility] if equivalent level of care is available:: Yes  Covid Evaluation: Asymptomatic Screening Protocol (No Symptoms)  Diagnosis: Symptomatic anemia [[id number]]  Admitting Physician: Shela Leff [[id number]]  Attending Physician: Shela Leff [[id number]]          Medical History History reviewed. No pertinent past medical history.  Allergies Allergies  Allergen Reactions   Iodinated Diagnostic Agents Other (See Comments)    Bag of oil formed, thought it was a goiter   Penicillins Hives    IV Location/Drains/Wounds Patient Lines/Drains/Airways Status     Active Line/Drains/Airways     Name Placement date Placement time Site Days   Peripheral IV 08/01/21 20 G Left Antecubital 08/01/21  2059  Antecubital  1   Peripheral IV 08/01/21 22 G Right Antecubital 08/01/21  2112  Antecubital  1            Labs/Imaging Results for orders placed or performed during the hospital encounter of 08/01/21 (from the past 48 hour(s))  Comprehensive metabolic panel     Status: Abnormal   Collection Time: 08/01/21  1:37 PM  Result Value Ref Range   Sodium 136 135 - 145 mmol/L   Potassium 4.2 3.5 - 5.1 mmol/L   Chloride 106 98 - 111 mmol/L   CO2 23 22 - 32 mmol/L   Glucose, Bld 217 (H) 70 - 99 mg/dL     Comment: Glucose reference range applies only to samples taken after fasting for at least 8 hours.   BUN 11 8 - 23 mg/dL   Creatinine, Ser 0.73 0.44 - 1.00 mg/dL   Calcium 9.1 8.9 - 10.3 mg/dL   Total Protein 7.4 6.5 - 8.1 g/dL   Albumin 3.6 3.5 - 5.0 g/dL   AST 17 15 - 41 U/L   ALT 12 0 - 44 U/L   Alkaline Phosphatase 76 38 - 126 U/L   Total Bilirubin 0.5 0.3 - 1.2 mg/dL   GFR, Estimated >60 >60 mL/min    Comment: (NOTE) Calculated using the CKD-EPI Creatinine Equation (2021)    Anion gap 7 5 - 15    Comment: Performed at [facility], [place] [address], [place], [place] [address]  Lipase, blood     Status: None   Collection Time: 08/01/21  1:37 PM  Result Value Ref Range   Lipase 32 11 - 51 U/L    Comment: Performed at [facility], [place] [address] [place],  [address]  CBC with Diff     Status: Abnormal   Collection Time: 08/01/21  1:37 PM  Result Value Ref Range   WBC 6.4 4.0 -  10.5 K/uL   RBC 3.48 (L) 3.87 - 5.11 MIL/uL   Hemoglobin 7.4 (L) 12.0 - 15.0 g/dL    Comment: Reticulocyte Hemoglobin testing may be clinically indicated, consider ordering this additional test [id number]    HCT 25.5 (L) 36.0 - 46.0 %   MCV 73.3 (L) 80.0 - 100.0 fL   MCH 21.3 (L) 26.0 - 34.0 pg   MCHC 29.0 (L) 30.0 - 36.0 g/dL   RDW 17.0 (H) 11.5 - 15.5 %   Platelets 408 (H) 150 - 400 K/uL   nRBC 0.0 0.0 - 0.2 %   Neutrophils Relative % 57 %   Neutro Abs 3.6 1.7 - 7.7 K/uL   Lymphocytes Relative 31 %   Lymphs Abs 1.9 0.7 - 4.0 K/uL   Monocytes Relative 9 %   Monocytes Absolute 0.6 0.1 - 1.0 K/uL   Eosinophils Relative 2 %   Eosinophils Absolute 0.2 0.0 - 0.5 K/uL   Basophils Relative 1 %   Basophils Absolute 0.0 0.0 - 0.1 K/uL   Immature Granulocytes 0 %   Abs Immature Granulocytes 0.01 0.00 - 0.07 K/uL    Comment: Performed at [facility], [place] [address] [place], [place] [address]  Urinalysis, Routine w reflex microscopic      Status: Abnormal   Collection Time: 08/01/21  6:24 PM  Result Value Ref Range   Color, Urine YELLOW YELLOW   APPearance HAZY (A) CLEAR   Specific Gravity, Urine 1.024 1.005 - 1.030   pH 5.0 5.0 - 8.0   Glucose, UA 50 (A) NEGATIVE mg/dL   Hgb urine dipstick NEGATIVE NEGATIVE   Bilirubin Urine NEGATIVE NEGATIVE   Ketones, ur NEGATIVE NEGATIVE mg/dL   Protein, ur NEGATIVE NEGATIVE mg/dL   Nitrite NEGATIVE NEGATIVE   Leukocytes,Ua MODERATE (A) NEGATIVE   RBC / HPF 0-5 0 - 5 RBC/hpf   WBC, UA 6-10 0 - 5 WBC/hpf   Bacteria, UA FEW (A) NONE SEEN   Squamous Epithelial / LPF 6-10 0 - 5   Mucus PRESENT     Comment: Performed at [facility], [place] [address], [place], [place] [address]  Prepare RBC (crossmatch)     Status: None   Collection Time: 08/01/21  7:00 PM  Result Value Ref Range   Order Confirmation      ORDER PROCESSED BY BLOOD BANK Performed at [facility], [place] [address] [place], [place] [address]   Type and screen [place]     Status: None (Preliminary result)   Collection Time: 08/01/21  7:00 PM  Result Value Ref Range   ABO/RH(D) A POS    Antibody Screen NEG    Sample Expiration 08/04/2021,2359    Unit Number [id number]    Blood Component Type RED CELLS,LR    Unit division 00    Status of Unit ISSUED    Transfusion Status OK TO TRANSFUSE    Crossmatch Result      Compatible Performed at [facility], [place] [address], [place], [place] [address]   Troponin I (High Sensitivity)     Status: None   Collection Time: 08/01/21  7:00 PM  Result Value Ref Range   Troponin I (High Sensitivity) 3 <18 ng/L    Comment: (NOTE) Elevated high sensitivity troponin I (hsTnI) values and significant  changes across serial measurements may suggest ACS but many other  chronic and acute conditions are known to elevate hsTnI results.  Refer to the "Links" section  for chest pain algorithms and additional   guidance. Performed at [facility], [place] [address], [place], [place] [address]   Brain natriuretic peptide     Status: None   Collection Time: 08/01/21  7:00 PM  Result Value Ref Range   B Natriuretic Peptide 52.1 0.0 - 100.0 pg/mL    Comment: Performed at [facility], [place] [address], [place], [place] [address]  Resp Panel by RT-PCR (Flu A&B, Covid) Nasopharyngeal Swab     Status: None   Collection Time: 08/01/21  7:00 PM   Specimen: Nasopharyngeal Swab; Nasopharyngeal(NP) swabs in vial transport medium  Result Value Ref Range   SARS Coronavirus 2 by RT PCR NEGATIVE NEGATIVE    Comment: (NOTE) SARS-CoV-2 target nucleic acids are NOT DETECTED.  The SARS-CoV-2 RNA is generally detectable in upper respiratory specimens during the acute phase of infection. The lowest concentration of SARS-CoV-2 viral copies this assay can detect is 138 copies/mL. A negative result does not preclude SARS-Cov-2 infection and should not be used as the sole basis for treatment or other patient management decisions. A negative result may occur with  improper specimen collection/handling, submission of specimen other than nasopharyngeal swab, presence of viral mutation(s) within the areas targeted by this assay, and inadequate number of viral copies(<138 copies/mL). A negative result must be combined with clinical observations, patient history, and epidemiological information. The expected result is Negative.  Fact Sheet for Patients:  EntrepreneurPulse.com.au  Fact Sheet for Healthcare Providers:  IncredibleEmployment.be  This test is no t yet approved or cleared by the [place] FDA and  has been authorized for detection and/or diagnosis of SARS-CoV-2 by FDA under an Emergency Use Authorization (EUA). This EUA will remain  in effect (meaning this test can be used) for the duration of the COVID-19 declaration under Section  564(b)(1) of the Act, 21 U.S.C.section 360bbb-3(b)(1), unless the authorization is terminated  or revoked sooner.       Influenza A by PCR NEGATIVE NEGATIVE   Influenza B by PCR NEGATIVE NEGATIVE    Comment: (NOTE) The Xpert Xpress SARS-CoV-2/FLU/RSV plus assay is intended as an aid in the diagnosis of influenza from Nasopharyngeal swab specimens and should not be used as a sole basis for treatment. Nasal washings and aspirates are unacceptable for Xpert Xpress SARS-CoV-2/FLU/RSV testing.  Fact Sheet for Patients: EntrepreneurPulse.com.au  Fact Sheet for Healthcare Providers: IncredibleEmployment.be  This test is not yet approved or cleared by the [place] FDA and has been authorized for detection and/or diagnosis of SARS-CoV-2 by FDA under an Emergency Use Authorization (EUA). This EUA will remain in effect (meaning this test can be used) for the duration of the COVID-19 declaration under Section 564(b)(1) of the Act, 21 U.S.C. section 360bbb-3(b)(1), unless the authorization is terminated or revoked.  Performed at [facility], [place] [address], [place], [place] [address]   POC occult blood, ED     Status: Abnormal   Collection Time: 08/01/21  9:44 PM  Result Value Ref Range   Fecal Occult Bld POSITIVE (A) NEGATIVE   CT ABDOMEN PELVIS WO CONTRAST  Result Date: 08/01/2021 CLINICAL DATA:  Abdominal pain EXAM: CT ABDOMEN AND PELVIS WITHOUT CONTRAST TECHNIQUE: Multidetector CT imaging of the abdomen and pelvis was performed following the standard protocol without IV contrast. COMPARISON:  CT abdomen and pelvis dated October 08, 2009 FINDINGS: Lower chest: Mitral annular calcifications. Moderate hiatal hernia. No acute abnormality. Hepatobiliary: Low-attenuation lesion of the left hepatic lobe is unchanged  compared to 2011 prior and likely a simple cyst. No suspicious liver lesions. Gallbladder is unremarkable. No biliary  ductal dilation. Pancreas: Unremarkable. No pancreatic ductal dilatation or surrounding inflammatory changes. Spleen: Normal in size without focal abnormality. Adrenals/Urinary Tract: Adrenal glands are unremarkable. Kidneys are normal, without renal calculi, focal lesion, or hydronephrosis. Bladder is unremarkable for degree of decompression. Stomach/Bowel: Stomach is within normal limits. Appendix is not definitely visualized. No evidence of bowel wall thickening, distention, or inflammatory changes. Vascular/Lymphatic: Aortic atherosclerosis. No enlarged abdominal or pelvic lymph nodes. Reproductive: No adnexal masses. Other: No abdominal wall hernia or abnormality. No abdominopelvic ascites. Musculoskeletal: No acute or significant osseous findings. IMPRESSION: 1. No acute findings in the abdomen or pelvis. 2. Aortic Atherosclerosis (ICD10-I70.0). Electronically Signed   By: Yetta Glassman M.D.   On: 08/01/2021 19:32    Pending Labs Unresulted Labs (From admission, onward)     Start     Ordered   08/02/21 0500  Hemoglobin and hematocrit, blood  Tomorrow morning,   R        08/01/21 2238   08/01/21 2239  Urine Culture  Add-on,   AD       Question:  Indication  Answer:  Sepsis   08/01/21 2238            Vitals/Pain Today's Vitals   08/01/21 2116 08/01/21 2130 08/01/21 2200 08/01/21 2330  BP: 128/68 131/69 129/61 133/74  Pulse: 67 68 69 74  Resp: 16   20  Temp: 97.9 F (36.6 C)   98.2 F (36.8 C)  TempSrc: Oral   Oral  SpO2: 100% 100% 100% 100%    Isolation Precautions No active isolations  Medications Medications  acetaminophen (TYLENOL) tablet 650 mg (has no administration in time range)    Or  acetaminophen (TYLENOL) suppository 650 mg (has no administration in time range)  pantoprazole (PROTONIX) injection 40 mg (40 mg Intravenous Given 08/01/21 2334)  0.9 %  sodium chloride infusion (0 mL/hr Intravenous Stopped 08/01/21 2334)    Mobility walks with device

## 2021-08-02 NOTE — Hospital Course (Addendum)
[name] is a 80 yo female with PMH afib, pseudophakia both eyes, hemorrhoids who presented to the ER with mild abdominal discomfort for at least the past 3 weeks.  She did have 1 episode of bloody stools just prior to admission.    Patient reports 54-month history of right lower quadrant abdominal pain and generalized weakness in the morning after she wakes up.  Also episodes of nausea but no vomiting.  Symptoms usually improve after she eats something.  Also had a few episodes of dark stools.  Reports lightheadedness in the morning and also dyspnea with exertion.  Patient states she had bloody stools 3 months ago which she thought were due to her eating a lot of popcorn.  Her PCP recommended colonoscopy but it has not been done yet.   She thinks she had a colonoscopy several years ago but cannot recall when or findings.   CT abdomen/pelvis was performed on admission which showed no acute findings. Hemoglobin was noted to be 7.4 g/dL.  No recent labs in epic for comparison.  Last noted labs are from 2011 at which time hemoglobin was 11 to 12 g/dL. She was transfused 1 unit PRBC on admission.  She underwent colonoscopy and EGD on 08/03/2021.  Colonoscopy was notable for an infiltrative and nonobstructing cecal mass.  General surgery was then consulted for further evaluation.  Patient was planned for undergoing right hemicolectomy on 08/05/2021. 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

## 2021-08-02 NOTE — Consult Note (Signed)
Reason for Consult: Anemia guaiac positivity Referring Physician: Hospital team  [name] is an 80 y.o. female.  HPI: Patient seen and examined and discussed with multiple family members as well as the hospital team in her hospital computer chart was reviewed and she has not had any previous GI work-up and reassurance was given about her CT scan which was reviewed and she was found this summer to be anemic and did have some bright red blood per rectum at that time but was never referred for colonoscopy and she does have 1 aunt with possible colon cancer but no other family members with any GI issues and she denies any aspirin or nonsteroidals or blood thinners and has noted some nausea for about a month in the shower and a weak feeling and periodically will have some reflux and indigestion and lately he has had a little increased upper abdominal discomfort which is helped by Pepto-Bismol and drinking water and she has no other complaints  History reviewed. No pertinent past medical history.  History reviewed. No pertinent surgical history.  History reviewed. No pertinent family history.  Social History:  reports that she has never smoked. She has never used smokeless tobacco. She reports that she does not drink alcohol and does not use drugs.  Allergies:  Allergies  Allergen Reactions   Iodinated Diagnostic Agents Other (See Comments)    Bag of oil formed, thought it was a goiter   Penicillins Hives    Medications: I have reviewed the patient's current medications.  Results for orders placed or performed during the hospital encounter of 08/01/21 (from the past 48 hour(s))  Comprehensive metabolic panel     Status: Abnormal   Collection Time: 08/01/21  1:37 PM  Result Value Ref Range   Sodium 136 135 - 145 mmol/L   Potassium 4.2 3.5 - 5.1 mmol/L   Chloride 106 98 - 111 mmol/L   CO2 23 22 - 32 mmol/L   Glucose, Bld 217 (H) 70 - 99 mg/dL    Comment: Glucose reference range  applies only to samples taken after fasting for at least 8 hours.   BUN 11 8 - 23 mg/dL   Creatinine, Ser 0.73 0.44 - 1.00 mg/dL   Calcium 9.1 8.9 - 10.3 mg/dL   Total Protein 7.4 6.5 - 8.1 g/dL   Albumin 3.6 3.5 - 5.0 g/dL   AST 17 15 - 41 U/L   ALT 12 0 - 44 U/L   Alkaline Phosphatase 76 38 - 126 U/L   Total Bilirubin 0.5 0.3 - 1.2 mg/dL   GFR, Estimated >60 >60 mL/min    Comment: (NOTE) Calculated using the CKD-EPI Creatinine Equation (2021)    Anion gap 7 5 - 15    Comment: Performed at [facility], [place] [address], [place], [place] [address]  Lipase, blood     Status: None   Collection Time: 08/01/21  1:37 PM  Result Value Ref Range   Lipase 32 11 - 51 U/L    Comment: Performed at [facility], [place] [address], [place], [place] [address]  CBC with Diff     Status: Abnormal   Collection Time: 08/01/21  1:37 PM  Result Value Ref Range   WBC 6.4 4.0 - 10.5 K/uL   RBC 3.48 (L) 3.87 - 5.11 MIL/uL   Hemoglobin 7.4 (L) 12.0 - 15.0 g/dL    Comment: Reticulocyte Hemoglobin testing may be clinically indicated, consider ordering this additional test [id number]  HCT 25.5 (L) 36.0 - 46.0 %   MCV 73.3 (L) 80.0 - 100.0 fL   MCH 21.3 (L) 26.0 - 34.0 pg   MCHC 29.0 (L) 30.0 - 36.0 g/dL   RDW 17.0 (H) 11.5 - 15.5 %   Platelets 408 (H) 150 - 400 K/uL   nRBC 0.0 0.0 - 0.2 %   Neutrophils Relative % 57 %   Neutro Abs 3.6 1.7 - 7.7 K/uL   Lymphocytes Relative 31 %   Lymphs Abs 1.9 0.7 - 4.0 K/uL   Monocytes Relative 9 %   Monocytes Absolute 0.6 0.1 - 1.0 K/uL   Eosinophils Relative 2 %   Eosinophils Absolute 0.2 0.0 - 0.5 K/uL   Basophils Relative 1 %   Basophils Absolute 0.0 0.0 - 0.1 K/uL   Immature Granulocytes 0 %   Abs Immature Granulocytes 0.01 0.00 - 0.07 K/uL    Comment: Performed at [facility], [place] [address], [place], [place] [address]  Urinalysis, Routine w reflex microscopic     Status: Abnormal   Collection  Time: 08/01/21  6:24 PM  Result Value Ref Range   Color, Urine YELLOW YELLOW   APPearance HAZY (A) CLEAR   Specific Gravity, Urine 1.024 1.005 - 1.030   pH 5.0 5.0 - 8.0   Glucose, UA 50 (A) NEGATIVE mg/dL   Hgb urine dipstick NEGATIVE NEGATIVE   Bilirubin Urine NEGATIVE NEGATIVE   Ketones, ur NEGATIVE NEGATIVE mg/dL   Protein, ur NEGATIVE NEGATIVE mg/dL   Nitrite NEGATIVE NEGATIVE   Leukocytes,Ua MODERATE (A) NEGATIVE   RBC / HPF 0-5 0 - 5 RBC/hpf   WBC, UA 6-10 0 - 5 WBC/hpf   Bacteria, UA FEW (A) NONE SEEN   Squamous Epithelial / LPF 6-10 0 - 5   Mucus PRESENT     Comment: Performed at [facility], [place] [address] [place], [place] [address]  Prepare RBC (crossmatch)     Status: None   Collection Time: 08/01/21  7:00 PM  Result Value Ref Range   Order Confirmation      ORDER PROCESSED BY BLOOD BANK Performed at [facility], [place] [address] [place], [place] [address]   Type and screen [place]     Status: None (Preliminary result)   Collection Time: 08/01/21  7:00 PM  Result Value Ref Range   ABO/RH(D) A POS    Antibody Screen NEG    Sample Expiration 08/04/2021,2359    Unit Number [id number]    Blood Component Type RED CELLS,LR    Unit division 00    Status of Unit ISSUED    Transfusion Status OK TO TRANSFUSE    Crossmatch Result      Compatible Performed at [facility], [place] [address], [place], [place] [address]   Troponin I (High Sensitivity)     Status: None   Collection Time: 08/01/21  7:00 PM  Result Value Ref Range   Troponin I (High Sensitivity) 3 <18 ng/L    Comment: (NOTE) Elevated high sensitivity troponin I (hsTnI) values and significant  changes across serial measurements may suggest ACS but many other  chronic and acute conditions are known to elevate hsTnI results.  Refer to the "Links" section for chest pain algorithms and additional  guidance. Performed at [facility], [place] [address], [place], [place] [address]   Brain natriuretic peptide     Status: None   Collection Time: 08/01/21  7:00  PM  Result Value Ref Range   B Natriuretic Peptide 52.1 0.0 - 100.0 pg/mL    Comment: Performed at [facility], [place] [address], [place], [place] [address]  Resp Panel by RT-PCR (Flu A&B, Covid) Nasopharyngeal Swab     Status: None   Collection Time: 08/01/21  7:00 PM   Specimen: Nasopharyngeal Swab; Nasopharyngeal(NP) swabs in vial transport medium  Result Value Ref Range   SARS Coronavirus 2 by RT PCR NEGATIVE NEGATIVE    Comment: (NOTE) SARS-CoV-2 target nucleic acids are NOT DETECTED.  The SARS-CoV-2 RNA is generally detectable in upper respiratory specimens during the acute phase of infection. The lowest concentration of SARS-CoV-2 viral copies this assay can detect is 138 copies/mL. A negative result does not preclude SARS-Cov-2 infection and should not be used as the sole basis for treatment or other patient management decisions. A negative result may occur with  improper specimen collection/handling, submission of specimen other than nasopharyngeal swab, presence of viral mutation(s) within the areas targeted by this assay, and inadequate number of viral copies(<138 copies/mL). A negative result must be combined with clinical observations, patient history, and epidemiological information. The expected result is Negative.  Fact Sheet for Patients:  EntrepreneurPulse.com.au  Fact Sheet for Healthcare Providers:  IncredibleEmployment.be  This test is no t yet approved or cleared by the [place] FDA and  has been authorized for detection and/or diagnosis of SARS-CoV-2 by FDA under an Emergency Use Authorization (EUA). This EUA will remain  in effect (meaning this test can be used) for the duration of the COVID-19 declaration under Section 564(b)(1) of the Act,  21 U.S.C.section 360bbb-3(b)(1), unless the authorization is terminated  or revoked sooner.       Influenza A by PCR NEGATIVE NEGATIVE   Influenza B by PCR NEGATIVE NEGATIVE    Comment: (NOTE) The Xpert Xpress SARS-CoV-2/FLU/RSV plus assay is intended as an aid in the diagnosis of influenza from Nasopharyngeal swab specimens and should not be used as a sole basis for treatment. Nasal washings and aspirates are unacceptable for Xpert Xpress SARS-CoV-2/FLU/RSV testing.  Fact Sheet for Patients: EntrepreneurPulse.com.au  Fact Sheet for Healthcare Providers: IncredibleEmployment.be  This test is not yet approved or cleared by the [place] FDA and has been authorized for detection and/or diagnosis of SARS-CoV-2 by FDA under an Emergency Use Authorization (EUA). This EUA will remain in effect (meaning this test can be used) for the duration of the COVID-19 declaration under Section 564(b)(1) of the Act, 21 U.S.C. section 360bbb-3(b)(1), unless the authorization is terminated or revoked.  Performed at [facility], [place] [address] [place], [place] [address]   POC occult blood, ED     Status: Abnormal   Collection Time: 08/01/21  9:44 PM  Result Value Ref Range   Fecal Occult Bld POSITIVE (A) NEGATIVE  Hemoglobin and hematocrit, blood     Status: Abnormal   Collection Time: 08/02/21  4:40 AM  Result Value Ref Range   Hemoglobin 8.1 (L) 12.0 - 15.0 g/dL   HCT 26.6 (L) 36.0 - 46.0 %    Comment: Performed at [facility], [place] [address], [place], [place] [address]    CT ABDOMEN PELVIS WO CONTRAST  Result Date: 08/01/2021 CLINICAL DATA:  Abdominal pain EXAM: CT ABDOMEN AND PELVIS WITHOUT CONTRAST TECHNIQUE: Multidetector CT imaging of the abdomen and pelvis was performed following the standard protocol without IV contrast. COMPARISON:  CT abdomen and pelvis dated October 08, 2009 FINDINGS: Lower  chest:  Mitral annular calcifications. Moderate hiatal hernia. No acute abnormality. Hepatobiliary: Low-attenuation lesion of the left hepatic lobe is unchanged compared to 2011 prior and likely a simple cyst. No suspicious liver lesions. Gallbladder is unremarkable. No biliary ductal dilation. Pancreas: Unremarkable. No pancreatic ductal dilatation or surrounding inflammatory changes. Spleen: Normal in size without focal abnormality. Adrenals/Urinary Tract: Adrenal glands are unremarkable. Kidneys are normal, without renal calculi, focal lesion, or hydronephrosis. Bladder is unremarkable for degree of decompression. Stomach/Bowel: Stomach is within normal limits. Appendix is not definitely visualized. No evidence of bowel wall thickening, distention, or inflammatory changes. Vascular/Lymphatic: Aortic atherosclerosis. No enlarged abdominal or pelvic lymph nodes. Reproductive: No adnexal masses. Other: No abdominal wall hernia or abnormality. No abdominopelvic ascites. Musculoskeletal: No acute or significant osseous findings. IMPRESSION: 1. No acute findings in the abdomen or pelvis. 2. Aortic Atherosclerosis (ICD10-I70.0). Electronically Signed   By: Yetta Glassman M.D.   On: 08/01/2021 19:32    ROS negative except above Blood pressure (!) 134/56, pulse 64, temperature 98.1 F (36.7 C), resp. rate 15, height 5\' 6"  (1.676 m), weight 85.7 kg, SpO2 100 %. Physical Exam vital signs stable afebrile no acute distress exam pertinent for abdomen being soft nontender labs reviewed CT reviewed  Assessment/Plan: Anemia guaiac positivity and patient overdue for colonic screening Plan: The risk benefits methods of colonoscopy and endoscopy was discussed with the patient and although she could go home and have these as an outpatient and set that up with her primary care physician I have offered her to do it tomorrow by my partner Dr. Alessandra Bevels and she agrees and we answered all of her and the family's questions and talked  about screening protocols as well  Papa Piercefield E 08/02/2021, 12:25 PM

## 2021-08-03 ENCOUNTER — Observation Stay (HOSPITAL_COMMUNITY): Payer: Medicare PPO | Admitting: Certified Registered Nurse Anesthetist

## 2021-08-03 ENCOUNTER — Encounter (HOSPITAL_COMMUNITY)
Admission: EM | Disposition: A | Discharge status: Home / Self Care | Payer: Self-pay | Source: Home / Self Care | Attending: Family Medicine

## 2021-08-03 ENCOUNTER — Encounter (HOSPITAL_COMMUNITY): Payer: Self-pay | Admitting: Internal Medicine

## 2021-08-03 DIAGNOSIS — K6389 Other specified diseases of intestine: Secondary | ICD-10-CM

## 2021-08-03 DIAGNOSIS — D649 Anemia, unspecified: Secondary | ICD-10-CM | POA: Diagnosis not present

## 2021-08-03 DIAGNOSIS — C180 Malignant neoplasm of cecum: Secondary | ICD-10-CM | POA: Insufficient documentation

## 2021-08-03 DIAGNOSIS — I4891 Unspecified atrial fibrillation: Secondary | ICD-10-CM | POA: Diagnosis present

## 2021-08-03 DIAGNOSIS — D509 Iron deficiency anemia, unspecified: Secondary | ICD-10-CM | POA: Diagnosis present

## 2021-08-03 DIAGNOSIS — R8290 Unspecified abnormal findings in urine: Secondary | ICD-10-CM | POA: Diagnosis present

## 2021-08-03 DIAGNOSIS — K649 Unspecified hemorrhoids: Secondary | ICD-10-CM | POA: Diagnosis present

## 2021-08-03 DIAGNOSIS — E119 Type 2 diabetes mellitus without complications: Secondary | ICD-10-CM | POA: Diagnosis present

## 2021-08-03 DIAGNOSIS — K219 Gastro-esophageal reflux disease without esophagitis: Secondary | ICD-10-CM | POA: Diagnosis present

## 2021-08-03 DIAGNOSIS — K921 Melena: Secondary | ICD-10-CM | POA: Diagnosis present

## 2021-08-03 DIAGNOSIS — Z91041 Radiographic dye allergy status: Secondary | ICD-10-CM | POA: Diagnosis not present

## 2021-08-03 DIAGNOSIS — D62 Acute posthemorrhagic anemia: Secondary | ICD-10-CM | POA: Diagnosis not present

## 2021-08-03 DIAGNOSIS — Z880 Allergy status to penicillin: Secondary | ICD-10-CM | POA: Diagnosis not present

## 2021-08-03 DIAGNOSIS — Z20822 Contact with and (suspected) exposure to covid-19: Secondary | ICD-10-CM | POA: Diagnosis present

## 2021-08-03 DIAGNOSIS — Z79899 Other long term (current) drug therapy: Secondary | ICD-10-CM | POA: Diagnosis not present

## 2021-08-03 DIAGNOSIS — K449 Diaphragmatic hernia without obstruction or gangrene: Secondary | ICD-10-CM | POA: Diagnosis present

## 2021-08-03 HISTORY — PX: ESOPHAGOGASTRODUODENOSCOPY: SHX5428

## 2021-08-03 HISTORY — PX: BIOPSY: SHX5522

## 2021-08-03 HISTORY — PX: COLONOSCOPY WITH PROPOFOL: SHX5780

## 2021-08-03 LAB — CBC
HCT: 27.5 % — ABNORMAL LOW (ref 36.0–46.0)
Hemoglobin: 8.1 g/dL — ABNORMAL LOW (ref 12.0–15.0)
MCH: 22.4 pg — ABNORMAL LOW (ref 26.0–34.0)
MCHC: 29.5 g/dL — ABNORMAL LOW (ref 30.0–36.0)
MCV: 76.2 fL — ABNORMAL LOW (ref 80.0–100.0)
Platelets: 357 10*3/uL (ref 150–400)
RBC: 3.61 MIL/uL — ABNORMAL LOW (ref 3.87–5.11)
RDW: 17.7 % — ABNORMAL HIGH (ref 11.5–15.5)
WBC: 4.7 10*3/uL (ref 4.0–10.5)
nRBC: 0 % (ref 0.0–0.2)

## 2021-08-03 LAB — BASIC METABOLIC PANEL
Anion gap: 8 (ref 5–15)
BUN: 8 mg/dL (ref 8–23)
CO2: 23 mmol/L (ref 22–32)
Calcium: 8.9 mg/dL (ref 8.9–10.3)
Chloride: 108 mmol/L (ref 98–111)
Creatinine, Ser: 0.59 mg/dL (ref 0.44–1.00)
GFR, Estimated: >60 mL/min (ref >60)
Glucose, Bld: 136 mg/dL — ABNORMAL HIGH (ref 70–99)
Potassium: 3.9 mmol/L (ref 3.5–5.1)
Sodium: 139 mmol/L (ref 135–145)

## 2021-08-03 LAB — URINE CULTURE: Culture: NO GROWTH

## 2021-08-03 LAB — MAGNESIUM: Magnesium: 2.2 mg/dL (ref 1.7–2.4)

## 2021-08-03 SURGERY — COLONOSCOPY WITH PROPOFOL
Anesthesia: Monitor Anesthesia Care

## 2021-08-03 MED ORDER — GLYCOPYRROLATE PF 0.2 MG/ML IJ SOSY
PREFILLED_SYRINGE | INTRAMUSCULAR | Status: DC | PRN
  Administered 2021-08-03: .2 mg via INTRAVENOUS

## 2021-08-03 MED ORDER — PROPOFOL 1000 MG/100ML IV EMUL
INTRAVENOUS | Status: AC
  Filled 2021-08-03: qty 200

## 2021-08-03 MED ORDER — PROPOFOL 10 MG/ML IV BOLUS
INTRAVENOUS | Status: DC | PRN
  Administered 2021-08-03 (×2): 20 mg via INTRAVENOUS

## 2021-08-03 MED ORDER — LACTATED RINGERS IV SOLN
INTRAVENOUS | Status: AC | PRN
  Administered 2021-08-03: 1000 mL via INTRAVENOUS

## 2021-08-03 MED ORDER — PROPOFOL 500 MG/50ML IV EMUL
INTRAVENOUS | Status: DC | PRN
  Administered 2021-08-03: 125 ug/kg/min via INTRAVENOUS

## 2021-08-03 MED ORDER — EPHEDRINE SULFATE-NACL 50-0.9 MG/10ML-% IV SOSY
PREFILLED_SYRINGE | INTRAVENOUS | Status: DC | PRN
  Administered 2021-08-03: 15 mg via INTRAVENOUS

## 2021-08-03 MED ORDER — LIDOCAINE 2% (20 MG/ML) 5 ML SYRINGE
INTRAMUSCULAR | Status: DC | PRN
  Administered 2021-08-03: 80 mg via INTRAVENOUS

## 2021-08-03 SURGICAL SUPPLY — 22 items

## 2021-08-03 NOTE — Assessment & Plan Note (Addendum)
-   patient presented with symptomatic anemia, RLQ abdominal pain. 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  Then discussed with radiology.  We will obtain CT chest without contrast for now to further stage.  CT ab/pelvis already performed this hospitalization.

## 2021-08-03 NOTE — Transfer of Care (Signed)
Immediate Anesthesia Transfer of Care Note  Patient: [name]  Procedure(s) Performed: COLONOSCOPY WITH PROPOFOL ESOPHAGOGASTRODUODENOSCOPY (EGD) BIOPSY  Patient Location: PACU and Endoscopy Unit  Anesthesia Type:MAC  Level of Consciousness: awake, alert  and patient cooperative  Airway & Oxygen Therapy: Patient Spontanous Breathing and Patient connected to face mask oxygen  Post-op Assessment: Report given to RN and Post -op Vital signs reviewed and stable  Post vital signs: Reviewed and stable  Last Vitals:  Vitals Value Taken Time  BP 95/34 08/03/21 1353  Temp    Pulse 82 08/03/21 1354  Resp 20 08/03/21 1354  SpO2 100 % 08/03/21 1354  Vitals shown include unvalidated device data.  Last Pain:  Vitals:   08/03/21 1225  TempSrc: Oral  PainSc: 0-No pain         Complications: No notable events documented.

## 2021-08-03 NOTE — Anesthesia Postprocedure Evaluation (Signed)
Anesthesia Post Note  Patient: KENSLI BOWLEY  Procedure(s) Performed: COLONOSCOPY WITH PROPOFOL ESOPHAGOGASTRODUODENOSCOPY (EGD) BIOPSY     Patient location during evaluation: PACU Anesthesia Type: MAC Level of consciousness: awake and alert Pain management: pain level controlled Vital Signs Assessment: post-procedure vital signs reviewed and stable Respiratory status: spontaneous breathing, nonlabored ventilation, respiratory function stable and patient connected to nasal cannula oxygen Cardiovascular status: stable and blood pressure returned to baseline Postop Assessment: no apparent nausea or vomiting Anesthetic complications: no   No notable events documented.  Last Vitals:  Vitals:   08/03/21 1430 08/03/21 1500  BP: (!) 119/46 (!) 123/57  Pulse: 78 74  Resp: 15 14  Temp:    SpO2: 99% 99%    Last Pain:  Vitals:   08/03/21 1430  TempSrc:   PainSc: 0-No pain                 Helton Oleson

## 2021-08-03 NOTE — Op Note (Signed)
[facility] Patient Name: [name] Procedure Date: 08/03/2021 MRN: [id number] Attending MD: Arta Silence , MD Date of Birth: September 30, 1940 CSN: [id number] Age: 80 Admit Type: Inpatient Procedure:                Colonoscopy Indications:              This is the patient's first colonoscopy, Iron                            deficiency anemia Providers:                Arta Silence, MD, Joya Gaskins, Luan Moore, Technician, Christell Faith, CRNA Referring MD:              Medicines:                Monitored Anesthesia Care Complications:            No immediate complications. Estimated Blood Loss:     Estimated blood loss: none. Procedure:                Pre-Anesthesia Assessment:                           - Prior to the procedure, a History and Physical                            was performed, and patient medications and                            allergies were reviewed. The patient's tolerance of                            previous anesthesia was also reviewed. The risks                            and benefits of the procedure and the sedation                            options and risks were discussed with the patient.                            All questions were answered, and informed consent                            was obtained. Prior Anticoagulants: The patient has                            taken no previous anticoagulant or antiplatelet                            agents. ASA Grade Assessment: II - A patient with                            mild  systemic disease. After reviewing the risks                            and benefits, the patient was deemed in                            satisfactory condition to undergo the procedure.                           After obtaining informed consent, the colonoscope                            was passed under direct vision. Throughout the                            procedure, the  patient's blood pressure, pulse, and                            oxygen saturations were monitored continuously. The                            [id number]-HQ190L ([id number]) Olympus colonoscope was                            introduced through the anus and advanced to the the                            cecum, identified by appendiceal orifice and                            ileocecal valve. The colonoscopy was performed                            without difficulty. The patient tolerated the                            procedure well. The quality of the bowel                            preparation was good. Scope In: 1:29:06 PM Scope Out: 1:45:39 PM Scope Withdrawal Time: 0 hours 9 minutes 54 seconds  Total Procedure Duration: 0 hours 16 minutes 33 seconds  Findings:      Hemorrhoids were found on perianal exam.      A 15 mm polyp was found in the proximal ascending colon. The polyp was       sessile.      A frond-like/villous, fungating, infiltrative and ulcerated       non-obstructing large mass was found in the cecum. The mass was       non-circumferential. In addition, its diameter measured five mm. Oozing       was present. This was biopsied with a cold forceps for histology.      The exam was otherwise without abnormality on direct and retroflexion       views. Impression:               -  Hemorrhoids found on perianal exam.                           - One 15 mm polyp in the proximal ascending colon.                            Not biopsied or removed (will be in anticipated                            right-hemicolectomy surgical field).                           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 Biopsied.                           - The examination was otherwise normal on direct                            and retroflexion views. Moderate Sedation:      None Recommendation:           - Return patient to hospital ward for ongoing care.                           - Clear liquid diet  today.                           - Continue present medications.                           - Await pathology results.                           - Inpatient surgical referral.. Procedure Code(s):        --- Professional ---                           [id number], Colonoscopy, flexible; with biopsy, single                            or multiple Diagnosis Code(s):        --- Professional ---                           K63.5, Polyp of colon                           D49.0, Neoplasm of unspecified behavior of                            digestive system                           K64.9, Unspecified hemorrhoids                           D50.9, Iron deficiency anemia, unspecified CPT copyright 2019 [employer]. All rights reserved. The  codes documented in this report are preliminary and upon coder review may  be revised to meet current compliance requirements. Arta Silence, MD 08/03/2021 2:02:42 PM This report has been signed electronically. Number of Addenda: 0

## 2021-08-03 NOTE — Op Note (Signed)
[facility] Patient Name: [name] Procedure Date: 08/03/2021 MRN: [id number] Attending MD: Arta Silence , MD Date of Birth: Sep 23, 1940 CSN: [id number] Age: 80 Admit Type: Inpatient Procedure:                Upper GI endoscopy Indications:              Iron deficiency anemia Providers:                Arta Silence, MD, Joya Gaskins, Luan Moore, Technician, Christell Faith, CRNA Referring MD:              Medicines:                Monitored Anesthesia Care Complications:             Estimated Blood Loss:     Estimated blood loss: none. Estimated blood loss:                            none. Procedure:                Pre-Anesthesia Assessment:                           - Prior to the procedure, a History and Physical                            was performed, and patient medications and                            allergies were reviewed. The patient's tolerance of                            previous anesthesia was also reviewed. The risks                            and benefits of the procedure and the sedation                            options and risks were discussed with the patient.                            All questions were answered, and informed consent                            was obtained. Prior Anticoagulants: The patient has                            taken no previous anticoagulant or antiplatelet                            agents. ASA Grade Assessment: II - A patient with                            mild systemic disease.  After reviewing the risks                            and benefits, the patient was deemed in                            satisfactory condition to undergo the procedure.                           After obtaining informed consent, the endoscope was                            passed under direct vision. Throughout the                            procedure, the patient's blood pressure, pulse,  and                            oxygen saturations were monitored continuously. The                            [id number]-H190 ([id number] Olympus endoscope was introduced                            through the mouth, and advanced to the second part                            of duodenum. The upper GI endoscopy was                            accomplished without difficulty. The patient                            tolerated the procedure well. Scope In: Scope Out: Findings:      A small hiatal hernia was present.      The exam of the esophagus was otherwise normal.      The entire examined stomach was normal.      The duodenal bulb, first portion of the duodenum and second portion of       the duodenum were normal. Impression:               - Small hiatal hernia.                           - Normal stomach.                           - Normal duodenal bulb, first portion of the                            duodenum and second portion of the duodenum.                           - No source of anemia identified. Moderate Sedation:      Not Applicable - Patient had care per  Anesthesia. Recommendation:           - Perform a colonoscopy today. Procedure Code(s):        --- Professional ---                           [phone], Esophagogastroduodenoscopy, flexible,                            transoral; diagnostic, including collection of                            specimen(s) by brushing or washing, when performed                            (separate procedure) Diagnosis Code(s):        --- Professional ---                           K44.9, Diaphragmatic hernia without obstruction or                            gangrene                           D50.9, Iron deficiency anemia, unspecified CPT copyright 2019 [employer]. All rights reserved. The codes documented in this report are preliminary and upon coder review may  be revised to meet current compliance requirements. Arta Silence,  MD 08/03/2021 1:56:31 PM This report has been signed electronically. Number of Addenda: 0

## 2021-08-03 NOTE — Interval H&P Note (Signed)
History and Physical Interval Note:  08/03/2021 12:43 PM  [name]  has presented today for surgery, with the diagnosis of Guaiac positive anemia upper tract symptoms.  The various methods of treatment have been discussed with the patient and family. After consideration of risks, benefits and other options for treatment, the patient has consented to  Procedure(s): COLONOSCOPY WITH PROPOFOL (N/A) ESOPHAGOGASTRODUODENOSCOPY (EGD) (N/A) as a surgical intervention.  The patient's history has been reviewed, patient examined, no change in status, stable for surgery.  I have reviewed the patient's chart and labs.  Questions were answered to the patient's satisfaction.     [name]

## 2021-08-03 NOTE — Anesthesia Preprocedure Evaluation (Addendum)
Anesthesia Evaluation  Patient identified by MRN, date of birth, ID band Patient awake    Reviewed: Allergy & Precautions, H&P , NPO status , Patient's Chart, lab work & pertinent test results, reviewed documented beta blocker date and time   Airway Mallampati: II  TM Distance: >3 FB Neck ROM: full    Dental no notable dental hx. (+) Partial Upper, Partial Lower, Dental Advisory Given   Pulmonary neg pulmonary ROS,    Pulmonary exam normal breath sounds clear to auscultation       Cardiovascular Exercise Tolerance: Good negative cardio ROS Normal cardiovascular exam Rhythm:regular Rate:Normal     Neuro/Psych negative neurological ROS  negative psych ROS   GI/Hepatic negative GI ROS, Neg liver ROS,   Endo/Other  negative endocrine ROS  Renal/GU negative Renal ROS  negative genitourinary   Musculoskeletal   Abdominal   Peds  Hematology  (+) Blood dyscrasia, anemia ,   Anesthesia Other Findings   Reproductive/Obstetrics negative OB ROS                            Anesthesia Physical Anesthesia Plan  ASA: 2  Anesthesia Plan: MAC   Post-op Pain Management:    Induction: Intravenous  PONV Risk Score and Plan: 2 and Propofol infusion  Airway Management Planned: Natural Airway, Nasal Cannula and Simple Face Mask  Additional Equipment: None  Intra-op Plan:   Post-operative Plan:   Informed Consent: I have reviewed the patients History and Physical, chart, labs and discussed the procedure including the risks, benefits and alternatives for the proposed anesthesia with the patient or authorized representative who has indicated his/her understanding and acceptance.     Dental Advisory Given  Plan Discussed with: CRNA and Anesthesiologist  Anesthesia Plan Comments:         Anesthesia Quick Evaluation

## 2021-08-03 NOTE — Progress Notes (Signed)
Progress Note    [name]   SEG:[id number]  DOB: 10/23/1940  DOA: 08/01/2021     0 PCP: Anita Huh, NP  Initial CC: Abdominal discomfort, some bloody stools  Hospital Course: [name] is a 80 yo female with PMH afib, pseudophakia both eyes, hemorrhoids who presented to the ER with mild abdominal discomfort for at least the past 3 weeks.  She did have 1 episode of bloody stools just prior to admission.    Patient reports 71-month history of right lower quadrant abdominal pain and generalized weakness in the morning after she wakes up.  Also episodes of nausea but no vomiting.  Symptoms usually improve after she eats something.  Also had a few episodes of dark stools.  Reports lightheadedness in the morning and also dyspnea with exertion.  Patient states she had bloody stools 3 months ago which she thought were due to her eating a lot of popcorn.  Her PCP recommended colonoscopy but it has not been done yet.   She thinks she had a colonoscopy several years ago but cannot recall when or findings.   CT abdomen/pelvis was performed on admission which showed no acute findings. Hemoglobin was noted to be 7.4 g/dL.  No recent labs in epic for comparison.  Last noted labs are from 2011 at which time hemoglobin was 11 to 12 g/dL. She was transfused 1 unit PRBC on admission.  She underwent colonoscopy and EGD on 08/03/2021.  Colonoscopy was notable for an infiltrative and nonobstructing cecal mass.  General surgery was then consulted for further evaluation.  Interval History:  No events overnight. Seen this morning prior to EGD and colonoscopy. Felt okay, had more energy after blood from admission.   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  This is her presumed source - continue BID PPI  Mass of cecum - patient presented with symptomatic anemia, RLQ abdominal pain. Found to have infiltrative, ulcerated, nonobstructing cecal mass on colonoscopy on 08/03/2021 -General surgery now consulted to further evaluate regarding need for right hemicolectomy.  Biopsy obtained of mass during colonoscopy  Abnormal urinalysis - asymptomatic. UA noted with mod LE, neg nitrite and only 6-10 WBC; holding off on treatment as not indicated   GI bleed - see anemia    Old records reviewed in assessment of this patient  Antimicrobials: N/a  DVT prophylaxis: SCD  Code Status:   Code Status: Full Code  Disposition Plan:   Status is: Inpatient    Objective: Blood pressure (!) 123/57, pulse 74, temperature 97.6 F (36.4 C), temperature source Axillary, resp. rate 14, height 5\' 6"  (1.676 m), weight 85.7 kg, SpO2 99 %.  Examination:  Physical Exam Constitutional:      General: She is not in acute distress.    Appearance: Normal appearance.  HENT:     Mouth/Throat:     Mouth: Mucous membranes are moist.  Eyes:     Extraocular Movements: Extraocular movements intact.  Cardiovascular:     Rate and Rhythm: Normal rate and regular rhythm.  Pulmonary:     Effort: Pulmonary effort is  normal.     Breath sounds: Normal breath sounds. No wheezing.  Abdominal:     General: Bowel sounds are normal.     Palpations: Abdomen is soft.     Comments: Mild mid abdominal TTP, no R/G  Musculoskeletal:        General: Normal range of motion.  Skin:    General: Skin is warm and dry.  Neurological:     General: No focal deficit present.     Mental Status: She is alert.  Psychiatric:        Mood and Affect: Mood normal.        Behavior: Behavior normal.      Consultants:  GI  Procedures:  EGD and CLN, 08/03/21  Data Reviewed: I have personally reviewed labs and imaging studies     LOS: 0 days  Time spent: Greater than 50% of the 35 minute visit was spent in counseling/coordination of care for the patient as laid out in the A&P.   Dwyane Dee, MD [facility] 08/03/2021, 3:30 PM

## 2021-08-03 NOTE — Consult Note (Signed)
[name] [birth year]  [id number].    Requesting MD: Dr. Paulita Fujita Chief Complaint/Reason for Consult: Cecal Mass  HPI: [name] is a 80 y.o. female who presented to [facility] for abdominal pain and GI bleed. Patient reports a few months ago she began having intermittent right sided abdominal pain. She saw her PCP, was diagnosed with anemia and reports she was scheduled for a colonoscopy as an outpatient for today with [facility]. Over the last few weeks she reports she awakes with nausea and fatigue that general improves as the day progresses. On 11/11 she had an episode of bright red blood per rectum, increased abdominal pain and nausea that prompted her to present to the ED for evaluation. She underwent CT scan was without any acute findings in the abdomen or pelvis. Hgb 7.4 on admission. She received one unit of PRBC on 11/12. TRH admitted and GI consulted. She underwent prep yesterday and tolerated with multiple non-bloody bm's but blood on tissue paper when she wiped. She underwent EGD today that was reassuring except small hiatal hernia. Colonoscopy showed a frond-like/villous, fungating, infiltrative and ulcerated non-obstructing large mass in the cecum. Oozing was present. This also showed a 15 mm polyp was found in the proximal ascending colon. This was the patients first colonoscopy. No recent weight loss. She reports she has 1 aunt with colon cancer history. She denies any medical hx other than anemia. No daily medications. She is not on any blood thinners. Hx of tubal ligation, otherwise no prior abdominal surgeries. We were asked to see.   ROS: Review of Systems  Constitutional:  Positive for malaise/fatigue. Negative for weight loss.  Respiratory:  Negative for shortness of breath.   Cardiovascular:  Negative for chest pain.  Gastrointestinal:  Positive for abdominal pain, blood in stool and nausea. Negative for melena and vomiting.   Psychiatric/Behavioral:  Negative for substance abuse.   All other systems reviewed and are negative.  History reviewed. No pertinent family history.  History reviewed. No pertinent past medical history.  History reviewed. No pertinent surgical history.  Social History:  reports that she has never smoked. She has never used smokeless tobacco. She reports that she does not drink alcohol and does not use drugs. Retired but still volunteers. Used to work for the [employer] No tobacco, alcohol or illicit drug use  Allergies:  Allergies  Allergen Reactions   Iodinated Diagnostic Agents Other (See Comments)    Bag of oil formed, thought it was a goiter   Penicillins Hives    Medications Prior to Admission  Medication Sig Dispense Refill   cholecalciferol (VITAMIN D3) 25 MCG (1000 UNIT) tablet Take 1,000 Units by mouth every other day.     Multiple Vitamin (MULTIVITAMIN ADULT) TABS Take 1 tablet by mouth daily.     hydrocortisone (ANUSOL-HC) 25 MG suppository Place 1 suppository (25 mg total) rectally 2 (two) times daily. (Patient not taking: Reported on 08/01/2021) 12 suppository 0     Physical Exam: Blood pressure (!) 119/46, pulse 78, temperature 97.6 F (36.4 C), temperature source Axillary, resp. rate 15, height 5\' 6"  (1.676 m), weight 85.7 kg, SpO2 99 %. General: pleasant, WD/WN AA female who is laying in bed in NAD HEENT: head is normocephalic, atraumatic.  Sclera are noninjected.  PERRL.  Ears and nose without any masses or lesions.  Mouth is pink and moist. Dentition fair Heart: regular, rate, and rhythm.  Normal s1,s2. No obvious murmurs, gallops, or rubs noted.  Palpable pedal pulses bilaterally  Lungs: CTAB, no wheezes, rhonchi, or rales noted.  Respiratory effort nonlabored Abd:  Soft, NT/ND, +BS, no masses, hernias, or organomegaly MS: no BUE/BLE edema, calves soft and nontender Skin: warm and dry with no masses, lesions, or rashes Psych: A&Ox4 with an appropriate  affect Neuro: cranial nerves grossly intact, equal strength in BUE/BLE bilaterally, normal speech, thought process intact, moves all extremities, gait not assessed  Results for orders placed or performed during the hospital encounter of 08/01/21 (from the past 48 hour(s))  Urinalysis, Routine w reflex microscopic     Status: Abnormal   Collection Time: 08/01/21  6:24 PM  Result Value Ref Range   Color, Urine YELLOW YELLOW   APPearance HAZY (A) CLEAR   Specific Gravity, Urine 1.024 1.005 - 1.030   pH 5.0 5.0 - 8.0   Glucose, UA 50 (A) NEGATIVE mg/dL   Hgb urine dipstick NEGATIVE NEGATIVE   Bilirubin Urine NEGATIVE NEGATIVE   Ketones, ur NEGATIVE NEGATIVE mg/dL   Protein, ur NEGATIVE NEGATIVE mg/dL   Nitrite NEGATIVE NEGATIVE   Leukocytes,Ua MODERATE (A) NEGATIVE   RBC / HPF 0-5 0 - 5 RBC/hpf   WBC, UA 6-10 0 - 5 WBC/hpf   Bacteria, UA FEW (A) NONE SEEN   Squamous Epithelial / LPF 6-10 0 - 5   Mucus PRESENT     Comment: Performed at [facility], [place] [address], [place], [place] [address]  Urine Culture     Status: None   Collection Time: 08/01/21  6:24 PM   Specimen: Urine, Clean Catch  Result Value Ref Range   Specimen Description      URINE, CLEAN CATCH Performed at [facility], [place] [address] [place], [place] [address]    Special Requests      NONE Performed at [facility], [place] [address], [place], [place] [address]    Culture      NO GROWTH Performed at [facility], [place] [address], [place], [place] [address]    Report Status 08/03/2021 FINAL   Prepare RBC (crossmatch)     Status: None   Collection Time: 08/01/21  7:00 PM  Result Value Ref Range   Order Confirmation      ORDER PROCESSED BY BLOOD BANK Performed at [facility], [place] [address], [place], [place] [address]   Type and screen [place]     Status: None   Collection Time: 08/01/21  7:00 PM  Result  Value Ref Range   ABO/RH(D) A POS    Antibody Screen NEG    Sample Expiration 08/04/2021,2359    Unit Number [id number]    Blood Component Type RED CELLS,LR    Unit division 00    Status of Unit ISSUED,FINAL    Transfusion Status OK TO TRANSFUSE    Crossmatch Result      Compatible Performed at [facility], [place] [address] [place], [place] [address]   Troponin I (High Sensitivity)     Status: None   Collection Time: 08/01/21  7:00 PM  Result Value Ref Range   Troponin I (High Sensitivity) 3 <18 ng/L    Comment: (NOTE) Elevated high sensitivity troponin I (hsTnI) values and significant  changes across serial measurements may suggest ACS but many other  chronic and acute conditions are known to elevate hsTnI results.  Refer to the "Links" section for chest pain algorithms and additional  guidance. Performed at [facility]  [place] [address] [place], [place] [address]   Brain natriuretic peptide     Status: None   Collection Time: 08/01/21  7:00 PM  Result Value Ref Range   B Natriuretic Peptide 52.1 0.0 - 100.0 pg/mL    Comment: Performed at [facility], [place] [address], [place], [place] [address]  Resp Panel by RT-PCR (Flu A&B, Covid) Nasopharyngeal Swab     Status: None   Collection Time: 08/01/21  7:00 PM   Specimen: Nasopharyngeal Swab; Nasopharyngeal(NP) swabs in vial transport medium  Result Value Ref Range   SARS Coronavirus 2 by RT PCR NEGATIVE NEGATIVE    Comment: (NOTE) SARS-CoV-2 target nucleic acids are NOT DETECTED.  The SARS-CoV-2 RNA is generally detectable in upper respiratory specimens during the acute phase of infection. The lowest concentration of SARS-CoV-2 viral copies this assay can detect is 138 copies/mL. A negative result does not preclude SARS-Cov-2 infection and should not be used as the sole basis for treatment or other patient management decisions. A negative result may occur with   improper specimen collection/handling, submission of specimen other than nasopharyngeal swab, presence of viral mutation(s) within the areas targeted by this assay, and inadequate number of viral copies(<138 copies/mL). A negative result must be combined with clinical observations, patient history, and epidemiological information. The expected result is Negative.  Fact Sheet for Patients:  EntrepreneurPulse.com.au  Fact Sheet for Healthcare Providers:  IncredibleEmployment.be  This test is no t yet approved or cleared by the [place] FDA and  has been authorized for detection and/or diagnosis of SARS-CoV-2 by FDA under an Emergency Use Authorization (EUA). This EUA will remain  in effect (meaning this test can be used) for the duration of the COVID-19 declaration under Section 564(b)(1) of the Act, 21 U.S.C.section 360bbb-3(b)(1), unless the authorization is terminated  or revoked sooner.       Influenza A by PCR NEGATIVE NEGATIVE   Influenza B by PCR NEGATIVE NEGATIVE    Comment: (NOTE) The Xpert Xpress SARS-CoV-2/FLU/RSV plus assay is intended as an aid in the diagnosis of influenza from Nasopharyngeal swab specimens and should not be used as a sole basis for treatment. Nasal washings and aspirates are unacceptable for Xpert Xpress SARS-CoV-2/FLU/RSV testing.  Fact Sheet for Patients: EntrepreneurPulse.com.au  Fact Sheet for Healthcare Providers: IncredibleEmployment.be  This test is not yet approved or cleared by the [place] FDA and has been authorized for detection and/or diagnosis of SARS-CoV-2 by FDA under an Emergency Use Authorization (EUA). This EUA will remain in effect (meaning this test can be used) for the duration of the COVID-19 declaration under Section 564(b)(1) of the Act, 21 U.S.C. section 360bbb-3(b)(1), unless the authorization is terminated or revoked.  Performed at  [facility], [place] [address], [place], [place] [address]   POC occult blood, ED     Status: Abnormal   Collection Time: 08/01/21  9:44 PM  Result Value Ref Range   Fecal Occult Bld POSITIVE (A) NEGATIVE  Hemoglobin and hematocrit, blood     Status: Abnormal   Collection Time: 08/02/21  4:40 AM  Result Value Ref Range   Hemoglobin 8.1 (L) 12.0 - 15.0 g/dL   HCT 26.6 (L) 36.0 - 46.0 %    Comment: Performed at [facility], [place] [address] [place], [place] [address]  Basic metabolic panel     Status: Abnormal   Collection Time: 08/03/21  4:03 AM  Result Value Ref Range  Sodium 139 135 - 145 mmol/L   Potassium 3.9 3.5 - 5.1 mmol/L   Chloride 108 98 - 111 mmol/L   CO2 23 22 - 32 mmol/L   Glucose, Bld 136 (H) 70 - 99 mg/dL    Comment: Glucose reference range applies only to samples taken after fasting for at least 8 hours.   BUN 8 8 - 23 mg/dL   Creatinine, Ser 0.59 0.44 - 1.00 mg/dL   Calcium 8.9 8.9 - 10.3 mg/dL   GFR, Estimated >60 >60 mL/min    Comment: (NOTE) Calculated using the CKD-EPI Creatinine Equation (2021)    Anion gap 8 5 - 15    Comment: Performed at [facility], [place] [address], [place], [place] [address]  Magnesium     Status: None   Collection Time: 08/03/21  4:03 AM  Result Value Ref Range   Magnesium 2.2 1.7 - 2.4 mg/dL    Comment: Performed at [facility], [place] [address], [place], [place] [address]  CBC     Status: Abnormal   Collection Time: 08/03/21  4:03 AM  Result Value Ref Range   WBC 4.7 4.0 - 10.5 K/uL   RBC 3.61 (L) 3.87 - 5.11 MIL/uL   Hemoglobin 8.1 (L) 12.0 - 15.0 g/dL    Comment: Reticulocyte Hemoglobin testing may be clinically indicated, consider ordering this additional test [id number]    HCT 27.5 (L) 36.0 - 46.0 %   MCV 76.2 (L) 80.0 - 100.0 fL   MCH 22.4 (L) 26.0 - 34.0 pg   MCHC 29.5 (L) 30.0 - 36.0 g/dL   RDW 17.7 (H) 11.5 - 15.5 %   Platelets 357 150 - 400  K/uL   nRBC 0.0 0.0 - 0.2 %    Comment: Performed at [facility], [place] [address], [place], [place] [address]   CT ABDOMEN PELVIS WO CONTRAST  Result Date: 08/01/2021 CLINICAL DATA:  Abdominal pain EXAM: CT ABDOMEN AND PELVIS WITHOUT CONTRAST TECHNIQUE: Multidetector CT imaging of the abdomen and pelvis was performed following the standard protocol without IV contrast. COMPARISON:  CT abdomen and pelvis dated October 08, 2009 FINDINGS: Lower chest: Mitral annular calcifications. Moderate hiatal hernia. No acute abnormality. Hepatobiliary: Low-attenuation lesion of the left hepatic lobe is unchanged compared to 2011 prior and likely a simple cyst. No suspicious liver lesions. Gallbladder is unremarkable. No biliary ductal dilation. Pancreas: Unremarkable. No pancreatic ductal dilatation or surrounding inflammatory changes. Spleen: Normal in size without focal abnormality. Adrenals/Urinary Tract: Adrenal glands are unremarkable. Kidneys are normal, without renal calculi, focal lesion, or hydronephrosis. Bladder is unremarkable for degree of decompression. Stomach/Bowel: Stomach is within normal limits. Appendix is not definitely visualized. No evidence of bowel wall thickening, distention, or inflammatory changes. Vascular/Lymphatic: Aortic atherosclerosis. No enlarged abdominal or pelvic lymph nodes. Reproductive: No adnexal masses. Other: No abdominal wall hernia or abnormality. No abdominopelvic ascites. Musculoskeletal: No acute or significant osseous findings. IMPRESSION: 1. No acute findings in the abdomen or pelvis. 2. Aortic Atherosclerosis (ICD10-I70.0). Electronically Signed   By: Yetta Glassman M.D.   On: 08/01/2021 19:32    Anti-infectives (From admission, onward)    None       Assessment/Plan Bleeding Cecal Mass ABL anemia  - This is a pleasant 80 year old female that presented with abdominal pain, BRB per rectum and found to have a oozing non-obstructing large mass  in the cecum. Path pending. Plan for partial colectomy this week. Will discuss with MD on timing.  Please do not advance past CLD.  - I discussed with the patient the anatomy and physiology of the GI tract using pictures/diagrams. The planned procedure and material risks were discussed with the patient. Risks include but are not limited to anesthesia (MI, CVA, prolonged intubation, aspiration, death), pain, bleeding,  infection, scarring, hernia, damage to surrounding structures (blood vessels/nerves/viscus/organs/ureter), ileus, leak from anastomosis and possible need for stoma/ileostomy. We also discussed typical post-operative care including the possible need for rehab/snf if necessary. The patient's questions were answered to their satisfaction, they voiced understanding and elected to proceed with surgery.  - CT of the abdomen and pelvis performed on admission and reassuring. Would recommend CT Chest for completion of workup. CA-125 written for. AM CBC  FEN - CLD VTE - SCDs ID - None   Jillyn Ledger, [facility] 08/03/2021, 2:50 PM Please see Amion for pager number during day hours 7:00am-4:30pm

## 2021-08-04 ENCOUNTER — Inpatient Hospital Stay (HOSPITAL_COMMUNITY): Payer: Medicare PPO

## 2021-08-04 ENCOUNTER — Encounter (HOSPITAL_COMMUNITY): Payer: Self-pay | Admitting: Internal Medicine

## 2021-08-04 LAB — BASIC METABOLIC PANEL
Anion gap: 7 (ref 5–15)
BUN: 6 mg/dL — ABNORMAL LOW (ref 8–23)
CO2: 24 mmol/L (ref 22–32)
Calcium: 8.9 mg/dL (ref 8.9–10.3)
Chloride: 109 mmol/L (ref 98–111)
Creatinine, Ser: 0.62 mg/dL (ref 0.44–1.00)
GFR, Estimated: >60 mL/min (ref >60)
Glucose, Bld: 137 mg/dL — ABNORMAL HIGH (ref 70–99)
Potassium: 3.9 mmol/L (ref 3.5–5.1)
Sodium: 140 mmol/L (ref 135–145)

## 2021-08-04 LAB — CBC
HCT: 26.5 % — ABNORMAL LOW (ref 36.0–46.0)
Hemoglobin: 8.1 g/dL — ABNORMAL LOW (ref 12.0–15.0)
MCH: 23.1 pg — ABNORMAL LOW (ref 26.0–34.0)
MCHC: 30.6 g/dL (ref 30.0–36.0)
MCV: 75.7 fL — ABNORMAL LOW (ref 80.0–100.0)
Platelets: 328 10*3/uL (ref 150–400)
RBC: 3.5 MIL/uL — ABNORMAL LOW (ref 3.87–5.11)
RDW: 18.2 % — ABNORMAL HIGH (ref 11.5–15.5)
WBC: 4.5 10*3/uL (ref 4.0–10.5)
nRBC: 0 % (ref 0.0–0.2)

## 2021-08-04 LAB — MAGNESIUM: Magnesium: 2.2 mg/dL (ref 1.7–2.4)

## 2021-08-04 LAB — PREPARE RBC (CROSSMATCH)

## 2021-08-04 LAB — SURGICAL PATHOLOGY

## 2021-08-04 MED ORDER — SODIUM CHLORIDE 0.9% IV SOLUTION: Freq: Once | INTRAVENOUS | Status: AC

## 2021-08-04 MED ORDER — BOOST / RESOURCE BREEZE PO LIQD CUSTOM
1.0000 | Freq: Three times a day (TID) | ORAL | Status: DC
  Administered 2021-08-04 – 2021-08-05 (×2): 1 via ORAL

## 2021-08-04 MED ORDER — LIP MEDEX EX OINT
TOPICAL_OINTMENT | CUTANEOUS | Status: AC
  Filled 2021-08-04: qty 7

## 2021-08-04 MED ORDER — SODIUM CHLORIDE 0.9 % IV SOLN
1.0000 g | INTRAVENOUS | Status: AC
  Administered 2021-08-05: 1 g via INTRAVENOUS
  Filled 2021-08-04 (×2): qty 1

## 2021-08-04 NOTE — Progress Notes (Signed)
Assessment & Plan: HD#4 - cecal mass  Biopsies pending  Maintain prep with clear liquids today, NPO after MN  Plan OR tomorrow for right colectomy  Oral abx prep today, Entereg peri-operatively Acute blood loss anemia  Hgb 8.1 this AM  Will ask medical service to consider transfusing 1U PRBC today in prep for OR  Discussed plans for right colectomy tomorrow in OR.  Discussed prep today and post op course to be expected.  Patient and family understand and wish to proceed.  The risks and benefits of the procedure have been discussed at length with the patient.  The patient understands the proposed procedure, potential alternative treatments, and the course of recovery to be expected.  All of the patient's questions have been answered at this time.  The patient wishes to proceed with surgery.        Armandina Gemma, MD       [facility]       Office: [phone]   Chief Complaint: Cecal mass with bleeding  Subjective: Patient comfortable, no complaints.  Wants to drink liquids.  Objective: Vital signs in last 24 hours: Temp:  [97.6 F (36.4 C)-98.6 F (37 C)] 98.5 F (36.9 C) (11/15 0700) Pulse Rate:  [69-89] 83 (11/14 2050) Resp:  [14-22] 18 (11/15 0700) BP: (101-129)/(27-67) 115/67 (11/15 0700) SpO2:  [99 %-100 %] 100 % (11/15 0700) Weight:  [85.7 kg] 85.7 kg (11/14 1225) Last BM Date: 08/03/21  Intake/Output from previous day: 11/14 0701 - 11/15 0700 In: 1280 [P.O.:480; I.V.:800] Out: -  Intake/Output this shift: No intake/output data recorded.  Physical Exam: HEENT - sclerae clear, mucous membranes moist Neck - soft Chest - clear bilaterally Cor - RRR Abdomen - soft, non-tender; no mass Ext - no edema, non-tender Neuro - alert & oriented, no focal deficits  Lab Results:  Recent Labs    08/03/21 0403 08/04/21 0411  WBC 4.7 4.5  HGB 8.1* 8.1*  HCT 27.5* 26.5*  PLT 357 328   BMET Recent Labs    08/03/21 0403 08/04/21 0411  NA  139 140  K 3.9 3.9  CL 108 109  CO2 23 24  GLUCOSE 136* 137*  BUN 8 6*  CREATININE 0.59 0.62  CALCIUM 8.9 8.9   PT/INR No results for input(s): LABPROT, INR in the last 72 hours. Comprehensive Metabolic Panel:    Component Value Date/Time   NA 140 08/04/2021 0411   NA 139 08/03/2021 0403   K 3.9 08/04/2021 0411   K 3.9 08/03/2021 0403   CL 109 08/04/2021 0411   CL 108 08/03/2021 0403   CO2 24 08/04/2021 0411   CO2 23 08/03/2021 0403   BUN 6 (L) 08/04/2021 0411   BUN 8 08/03/2021 0403   CREATININE 0.62 08/04/2021 0411   CREATININE 0.59 08/03/2021 0403   GLUCOSE 137 (H) 08/04/2021 0411   GLUCOSE 136 (H) 08/03/2021 0403   CALCIUM 8.9 08/04/2021 0411   CALCIUM 8.9 08/03/2021 0403   AST 17 08/01/2021 1337   AST 19 02/28/2021 1940   ALT 12 08/01/2021 1337   ALT 13 02/28/2021 1940   ALKPHOS 76 08/01/2021 1337   ALKPHOS 74 02/28/2021 1940   BILITOT 0.5 08/01/2021 1337   BILITOT 0.5 02/28/2021 1940   PROT 7.4 08/01/2021 1337   PROT 7.3 02/28/2021 1940   ALBUMIN 3.6 08/01/2021 1337   ALBUMIN 3.6 02/28/2021 1940    Studies/Results: No results found.    Armandina Gemma 08/04/2021   Patient ID:  [name], female   DOB: [birth year], 80 y.o.   MRN: [id number]

## 2021-08-04 NOTE — Progress Notes (Addendum)
General Surgery Follow Up Note  Subjective:    Overnight Issues:   Objective:  Vital signs for last 24 hours: Temp:  [97.6 F (36.4 C)-98.6 F (37 C)] 98.6 F (37 C) (11/14 2050) Pulse Rate:  [69-89] 83 (11/14 2050) Resp:  [14-22] 18 (11/14 2050) BP: (101-129)/(27-67) 129/67 (11/14 2050) SpO2:  [99 %-100 %] 100 % (11/14 2050) Weight:  [85.7 kg] 85.7 kg (11/14 1225)  Hemodynamic parameters for last 24 hours:    Intake/Output from previous day: 11/14 0701 - 11/15 0700 In: 1160 [P.O.:360; I.V.:800] Out: -   Intake/Output this shift: Total I/O In: 360 [P.O.:360] Out: -   Vent settings for last 24 hours:    Physical Exam:  Gen: comfortable, no distress Neuro: non-focal exam HEENT: PERRL Neck: supple CV: RRR Pulm: unlabored breathing Abd: soft, mild RLQ TTP GU: clear yellow urine Extr: wwp, no edema   Results for orders placed or performed during the hospital encounter of 08/01/21 (from the past 24 hour(s))  BLOOD TRANSFUSION REPORT - SCANNED     Status: None   Collection Time: 08/03/21 12:13 PM   Narrative   Ordered by an unspecified provider.  Basic metabolic panel     Status: Abnormal   Collection Time: 08/04/21  4:11 AM  Result Value Ref Range   Sodium 140 135 - 145 mmol/L   Potassium 3.9 3.5 - 5.1 mmol/L   Chloride 109 98 - 111 mmol/L   CO2 24 22 - 32 mmol/L   Glucose, Bld 137 (H) 70 - 99 mg/dL   BUN 6 (L) 8 - 23 mg/dL   Creatinine, Ser 0.62 0.44 - 1.00 mg/dL   Calcium 8.9 8.9 - 10.3 mg/dL   GFR, Estimated >60 >60 mL/min   Anion gap 7 5 - 15  Magnesium     Status: None   Collection Time: 08/04/21  4:11 AM  Result Value Ref Range   Magnesium 2.2 1.7 - 2.4 mg/dL  CBC     Status: Abnormal   Collection Time: 08/04/21  4:11 AM  Result Value Ref Range   WBC 4.5 4.0 - 10.5 K/uL   RBC 3.50 (L) 3.87 - 5.11 MIL/uL   Hemoglobin 8.1 (L) 12.0 - 15.0 g/dL   HCT 26.5 (L) 36.0 - 46.0 %   MCV 75.7 (L) 80.0 - 100.0 fL   MCH 23.1 (L) 26.0 - 34.0 pg   MCHC  30.6 30.0 - 36.0 g/dL   RDW 18.2 (H) 11.5 - 15.5 %   Platelets 328 150 - 400 K/uL   nRBC 0.0 0.0 - 0.2 %    Assessment & Plan:  Present on Admission:  Symptomatic anemia    LOS: 1 day   Additional comments:I reviewed the patient's new clinical lab test results.   and I reviewed the patients new imaging test results.    Bleeding, nonobstructing cecal mass with ABL anemia - s/p EGD with c-scope and bx taken 11/14 by GI. Cont CLD. CA-125 to be drawn this AM. Plan for surgical resection, patient agreeable. Will discuss timing. CT of the abdomen and pelvis performed on admission and reassuring. Would recommend CT chest pre-operatively for completion of workup.    FEN - CLD, do not advance. Added Boost. VTE - SCDs ID - None   Jesusita Oka, MD Trauma & General Surgery Please use [email or web address] to contact on call provider  08/04/2021  *Care during the described time interval was provided by me. I have reviewed this patient's available data, including  medical history, events of note, physical examination and test results as part of my evaluation.

## 2021-08-04 NOTE — Progress Notes (Signed)
Progress Note    CAMILLE DRAGAN   MWU:[id number]  DOB: 24-Sep-1940  DOA: 08/01/2021     1 PCP: Simona Huh, NP  Initial CC: Abdominal discomfort, some bloody stools  Hospital Course: Ms. Widrig is a 80 yo female with PMH afib, pseudophakia both eyes, hemorrhoids who presented to the ER with mild abdominal discomfort for at least the past 3 weeks.  She did have 1 episode of bloody stools just prior to admission.    Patient reports 35-month history of right lower quadrant abdominal pain and generalized weakness in the morning after she wakes up.  Also episodes of nausea but no vomiting.  Symptoms usually improve after she eats something.  Also had a few episodes of dark stools.  Reports lightheadedness in the morning and also dyspnea with exertion.  Patient states she had bloody stools 3 months ago which she thought were due to her eating a lot of popcorn.  Her PCP recommended colonoscopy but it has not been done yet.   She thinks she had a colonoscopy several years ago but cannot recall when or findings.   CT abdomen/pelvis was performed on admission which showed no acute findings. Hemoglobin was noted to be 7.4 g/dL.  No recent labs in epic for comparison.  Last noted labs are from 2011 at which time hemoglobin was 11 to 12 g/dL. She was transfused 1 unit PRBC on admission.  She underwent colonoscopy and EGD on 08/03/2021.  Colonoscopy was notable for an infiltrative and nonobstructing cecal mass.  General surgery was then consulted for further evaluation.  Patient was planned for undergoing right hemicolectomy on 08/05/2021. 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  Interval History:  No events overnight.  Family present bedside this morning.  I reviewed findings of biopsy results personally with the patient and her family present.  She understands plan is for surgery tomorrow. Also discussed her history of iodine allergy.  Difficult to  understand reaction but appears to have had some associated shortness of breath with neck swelling requiring surgical intervention but description is unclear from patient due to remote history.  Regardless, discussed with radiology as well; we will pursue noncontrasted CT chest for now for completing staging work-up.    Assessment & Plan: * Symptomatic anemia - no recent labs to compare but Hgb on admission 7.4 g/dL (priors ~11-12); mild persistent abd discomfort with intermittent bleeding with afib noted in her history, so differential includes ischemic event vs underlying ulcer (though no predisposing risk facts) vs diverticuler bleed vs hemorrhoids - s/p 1 unit PRBC; Hgb up to 8.1 g/dL  -Cecal mass found on colonoscopy on 08/03/2021 with oozing and was noted to be ulcerated.  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  Then discussed with radiology.  We will obtain CT chest without contrast for now to further stage.  CT ab/pelvis already performed this hospitalization.  Abnormal urinalysis - asymptomatic. UA noted with mod LE, neg nitrite and only 6-10 WBC; holding off on treatment as not indicated   GI bleed - see anemia    Old records reviewed in assessment of  this patient  Antimicrobials: N/a  DVT prophylaxis: SCD  Code Status:   Code Status: Full Code  Disposition Plan:   Status is: Inpatient    Objective: Blood pressure (!) 130/58, pulse 64, temperature 98.4 F (36.9 C), temperature source  Oral, resp. rate 15, height 5\' 6"  (1.676 m), weight 85.7 kg, SpO2 100 %.  Examination:  Physical Exam Constitutional:      General: She is not in acute distress.    Appearance: Normal appearance.  HENT:     Mouth/Throat:     Mouth: Mucous membranes are moist.  Eyes:     Extraocular Movements: Extraocular movements intact.  Cardiovascular:     Rate and Rhythm: Normal rate and regular rhythm.  Pulmonary:     Effort: Pulmonary effort is normal.     Breath sounds: Normal breath sounds. No wheezing.  Abdominal:     General: Bowel sounds are normal.     Palpations: Abdomen is soft.     Comments: Mild mid abdominal TTP, no R/G  Musculoskeletal:        General: Normal range of motion.  Skin:    General: Skin is warm and dry.  Neurological:     General: No focal deficit present.     Mental Status: She is alert.  Psychiatric:        Mood and Affect: Mood normal.        Behavior: Behavior normal.     Consultants:  GI  Procedures:  EGD and CLN, 08/03/21 Tentatively planned for right hemicolectomy on 08/05/2021  Data Reviewed: I have personally reviewed labs and imaging studies     LOS: 1 day  Time spent: Greater than 50% of the 35 minute visit was spent in counseling/coordination of care for the patient as laid out in the A&P.   Dwyane Dee, MD [facility] 08/04/2021, 1:59 PM

## 2021-08-04 NOTE — Progress Notes (Signed)
Colon mass biopsies showed adenocarcinoma.  Appreciate surgical team management.  [facility] will sign-off; please call with questions; thank you for the consultation.

## 2021-08-05 ENCOUNTER — Other Ambulatory Visit: Payer: Self-pay

## 2021-08-05 ENCOUNTER — Encounter (HOSPITAL_COMMUNITY)
Admission: EM | Disposition: A | Discharge status: Home / Self Care | Payer: Self-pay | Source: Home / Self Care | Attending: Family Medicine

## 2021-08-05 ENCOUNTER — Inpatient Hospital Stay (HOSPITAL_COMMUNITY): Payer: Medicare PPO | Admitting: Anesthesiology

## 2021-08-05 ENCOUNTER — Encounter (HOSPITAL_COMMUNITY): Payer: Self-pay | Admitting: Internal Medicine

## 2021-08-05 DIAGNOSIS — C180 Malignant neoplasm of cecum: Principal | ICD-10-CM

## 2021-08-05 HISTORY — PX: PARTIAL COLECTOMY: SHX5273

## 2021-08-05 LAB — CBC
HCT: 30.6 % — ABNORMAL LOW (ref 36.0–46.0)
Hemoglobin: 9.7 g/dL — ABNORMAL LOW (ref 12.0–15.0)
MCH: 24.3 pg — ABNORMAL LOW (ref 26.0–34.0)
MCHC: 31.7 g/dL (ref 30.0–36.0)
MCV: 76.7 fL — ABNORMAL LOW (ref 80.0–100.0)
Platelets: 332 10*3/uL (ref 150–400)
RBC: 3.99 MIL/uL (ref 3.87–5.11)
RDW: 18.8 % — ABNORMAL HIGH (ref 11.5–15.5)
WBC: 4.6 10*3/uL (ref 4.0–10.5)
nRBC: 0 % (ref 0.0–0.2)

## 2021-08-05 LAB — BPAM RBC
Blood Product Expiration Date: 202212052359
Blood Product Expiration Date: 202212072359
ISSUE DATE / TIME: 202211122050
ISSUE DATE / TIME: 202211152300
Unit Type and Rh: 6200
Unit Type and Rh: 6200

## 2021-08-05 LAB — TYPE AND SCREEN
ABO/RH(D): A POS
Antibody Screen: NEGATIVE
Unit division: 0
Unit division: 0

## 2021-08-05 LAB — BASIC METABOLIC PANEL
Anion gap: 6 (ref 5–15)
BUN: 6 mg/dL — ABNORMAL LOW (ref 8–23)
CO2: 25 mmol/L (ref 22–32)
Calcium: 8.9 mg/dL (ref 8.9–10.3)
Chloride: 109 mmol/L (ref 98–111)
Creatinine, Ser: 0.7 mg/dL (ref 0.44–1.00)
GFR, Estimated: >60 mL/min (ref >60)
Glucose, Bld: 121 mg/dL — ABNORMAL HIGH (ref 70–99)
Potassium: 3.5 mmol/L (ref 3.5–5.1)
Sodium: 140 mmol/L (ref 135–145)

## 2021-08-05 LAB — CA 125: Cancer Antigen (CA) 125: 4.1 U/mL (ref 0.0–38.1)

## 2021-08-05 LAB — MAGNESIUM: Magnesium: 1.9 mg/dL (ref 1.7–2.4)

## 2021-08-05 SURGERY — COLECTOMY, PARTIAL
Anesthesia: General | Site: Abdomen | Laterality: Right

## 2021-08-05 MED ORDER — HYDROMORPHONE HCL 1 MG/ML IJ SOLN: 0.2500 mg | INTRAMUSCULAR | Status: DC | PRN

## 2021-08-05 MED ORDER — HYDRALAZINE HCL 20 MG/ML IJ SOLN: 10.0000 mg | INTRAMUSCULAR | Status: DC | PRN

## 2021-08-05 MED ORDER — METRONIDAZOLE 500 MG PO TABS: 1000.0000 mg | ORAL_TABLET | ORAL | Status: DC

## 2021-08-05 MED ORDER — DEXAMETHASONE SODIUM PHOSPHATE 10 MG/ML IJ SOLN
INTRAMUSCULAR | Status: AC
  Filled 2021-08-05: qty 1

## 2021-08-05 MED ORDER — ENSURE SURGERY PO LIQD: 237.0000 mL | Freq: Two times a day (BID) | ORAL | Status: DC

## 2021-08-05 MED ORDER — PROPOFOL 10 MG/ML IV BOLUS
INTRAVENOUS | Status: DC | PRN
  Administered 2021-08-05: 30 mg via INTRAVENOUS
  Administered 2021-08-05: 100 mg via INTRAVENOUS

## 2021-08-05 MED ORDER — PHENYLEPHRINE 40 MCG/ML (10ML) SYRINGE FOR IV PUSH (FOR BLOOD PRESSURE SUPPORT)
PREFILLED_SYRINGE | INTRAVENOUS | Status: DC | PRN
  Administered 2021-08-05 (×3): 80 ug via INTRAVENOUS

## 2021-08-05 MED ORDER — ERYTHROMYCIN BASE 250 MG PO TABS
1000.0000 mg | ORAL_TABLET | Freq: Once | ORAL | Status: AC
  Administered 2021-08-05: 1000 mg via ORAL
  Filled 2021-08-05: qty 4

## 2021-08-05 MED ORDER — CHLORHEXIDINE GLUCONATE CLOTH 2 % EX PADS: 6.0000 | MEDICATED_PAD | Freq: Once | CUTANEOUS | Status: DC

## 2021-08-05 MED ORDER — ROCURONIUM BROMIDE 10 MG/ML (PF) SYRINGE
PREFILLED_SYRINGE | INTRAVENOUS | Status: AC
  Filled 2021-08-05: qty 10

## 2021-08-05 MED ORDER — KCL IN DEXTROSE-NACL 20-5-0.45 MEQ/L-%-% IV SOLN
INTRAVENOUS | Status: DC
  Filled 2021-08-05 (×3): qty 1000

## 2021-08-05 MED ORDER — MELATONIN 3 MG PO TABS: 3.0000 mg | ORAL_TABLET | Freq: Every evening | ORAL | Status: DC | PRN

## 2021-08-05 MED ORDER — PHENYLEPHRINE 40 MCG/ML (10ML) SYRINGE FOR IV PUSH (FOR BLOOD PRESSURE SUPPORT)
PREFILLED_SYRINGE | INTRAVENOUS | Status: AC
  Filled 2021-08-05: qty 10

## 2021-08-05 MED ORDER — ALVIMOPAN 12 MG PO CAPS: 12.0000 mg | ORAL_CAPSULE | Freq: Two times a day (BID) | ORAL | Status: DC

## 2021-08-05 MED ORDER — SIMETHICONE 80 MG PO CHEW: 40.0000 mg | CHEWABLE_TABLET | Freq: Four times a day (QID) | ORAL | Status: DC | PRN

## 2021-08-05 MED ORDER — ACETAMINOPHEN 500 MG PO TABS
1000.0000 mg | ORAL_TABLET | Freq: Four times a day (QID) | ORAL | Status: DC
  Administered 2021-08-05: 1000 mg via ORAL
  Filled 2021-08-05 (×2): qty 2

## 2021-08-05 MED ORDER — BUPIVACAINE LIPOSOME 1.3 % IJ SUSP
INTRAMUSCULAR | Status: DC | PRN
  Administered 2021-08-05: 20 mL

## 2021-08-05 MED ORDER — FENTANYL CITRATE (PF) 100 MCG/2ML IJ SOLN
INTRAMUSCULAR | Status: DC | PRN
  Administered 2021-08-05 (×5): 50 ug via INTRAVENOUS

## 2021-08-05 MED ORDER — FENTANYL CITRATE (PF) 100 MCG/2ML IJ SOLN
INTRAMUSCULAR | Status: AC
  Filled 2021-08-05: qty 2

## 2021-08-05 MED ORDER — ACETAMINOPHEN 500 MG PO TABS: 1000.0000 mg | ORAL_TABLET | Freq: Once | ORAL | Status: DC

## 2021-08-05 MED ORDER — SUGAMMADEX SODIUM 200 MG/2ML IV SOLN
INTRAVENOUS | Status: DC | PRN
  Administered 2021-08-05: 200 mg via INTRAVENOUS

## 2021-08-05 MED ORDER — ONDANSETRON HCL 4 MG PO TABS: 4.0000 mg | ORAL_TABLET | Freq: Four times a day (QID) | ORAL | Status: DC | PRN

## 2021-08-05 MED ORDER — OXYCODONE HCL 5 MG PO TABS: 5.0000 mg | ORAL_TABLET | Freq: Once | ORAL | Status: DC | PRN

## 2021-08-05 MED ORDER — PROMETHAZINE HCL 25 MG/ML IJ SOLN
INTRAMUSCULAR | Status: AC
  Administered 2021-08-05: 6.25 mg via INTRAVENOUS
  Filled 2021-08-05: qty 1

## 2021-08-05 MED ORDER — BUPIVACAINE LIPOSOME 1.3 % IJ SUSP
INTRAMUSCULAR | Status: AC
  Filled 2021-08-05: qty 20

## 2021-08-05 MED ORDER — ONDANSETRON HCL 4 MG/2ML IJ SOLN
INTRAMUSCULAR | Status: DC | PRN
  Administered 2021-08-05: 4 mg via INTRAVENOUS

## 2021-08-05 MED ORDER — ONDANSETRON HCL 4 MG/2ML IJ SOLN
4.0000 mg | Freq: Four times a day (QID) | INTRAMUSCULAR | Status: DC | PRN
  Administered 2021-08-06: 4 mg via INTRAVENOUS
  Filled 2021-08-05: qty 2

## 2021-08-05 MED ORDER — ROCURONIUM BROMIDE 10 MG/ML (PF) SYRINGE
PREFILLED_SYRINGE | INTRAVENOUS | Status: DC | PRN
  Administered 2021-08-05: 60 mg via INTRAVENOUS

## 2021-08-05 MED ORDER — PROMETHAZINE HCL 25 MG/ML IJ SOLN: 6.2500 mg | INTRAMUSCULAR | Status: DC | PRN

## 2021-08-05 MED ORDER — LACTATED RINGERS IV SOLN: INTRAVENOUS | Status: DC

## 2021-08-05 MED ORDER — CHLORHEXIDINE GLUCONATE 0.12 % MT SOLN
15.0000 mL | Freq: Once | OROMUCOSAL | Status: AC
  Administered 2021-08-05: 15 mL via OROMUCOSAL

## 2021-08-05 MED ORDER — MIDAZOLAM HCL 2 MG/2ML IJ SOLN: 0.5000 mg | Freq: Once | INTRAMUSCULAR | Status: DC | PRN

## 2021-08-05 MED ORDER — METRONIDAZOLE 500 MG PO TABS
1000.0000 mg | ORAL_TABLET | Freq: Once | ORAL | Status: AC
  Administered 2021-08-05: 1000 mg via ORAL
  Filled 2021-08-05: qty 2

## 2021-08-05 MED ORDER — DEXAMETHASONE SODIUM PHOSPHATE 4 MG/ML IJ SOLN
INTRAMUSCULAR | Status: DC | PRN
  Administered 2021-08-05: 5 mg via INTRAVENOUS

## 2021-08-05 MED ORDER — ENSURE ENLIVE PO LIQD
237.0000 mL | Freq: Two times a day (BID) | ORAL | Status: DC
  Administered 2021-08-07 – 2021-08-09 (×3): 237 mL via ORAL

## 2021-08-05 MED ORDER — LIDOCAINE HCL (PF) 2 % IJ SOLN
INTRAMUSCULAR | Status: AC
  Filled 2021-08-05: qty 5

## 2021-08-05 MED ORDER — LIDOCAINE HCL (CARDIAC) PF 100 MG/5ML IV SOSY
PREFILLED_SYRINGE | INTRAVENOUS | Status: DC | PRN
  Administered 2021-08-05: 60 mg via INTRAVENOUS

## 2021-08-05 MED ORDER — BUPIVACAINE HCL 0.25 % IJ SOLN
INTRAMUSCULAR | Status: DC | PRN
  Administered 2021-08-05: 20 mL

## 2021-08-05 MED ORDER — ALVIMOPAN 12 MG PO CAPS
12.0000 mg | ORAL_CAPSULE | ORAL | Status: AC
  Administered 2021-08-05: 12 mg via ORAL
  Filled 2021-08-05: qty 1

## 2021-08-05 MED ORDER — NEOMYCIN SULFATE 500 MG PO TABS: 1000.0000 mg | ORAL_TABLET | ORAL | Status: DC

## 2021-08-05 MED ORDER — OXYCODONE HCL 5 MG/5ML PO SOLN: 5.0000 mg | Freq: Once | ORAL | Status: DC | PRN

## 2021-08-05 MED ORDER — MEPERIDINE HCL 50 MG/ML IJ SOLN: 6.2500 mg | INTRAMUSCULAR | Status: DC | PRN

## 2021-08-05 MED ORDER — OXYCODONE HCL 5 MG PO TABS: 5.0000 mg | ORAL_TABLET | ORAL | Status: DC | PRN

## 2021-08-05 MED ORDER — 0.9 % SODIUM CHLORIDE (POUR BTL) OPTIME
TOPICAL | Status: DC | PRN
  Administered 2021-08-05 (×2): 1000 mL

## 2021-08-05 MED ORDER — DIPHENHYDRAMINE HCL 50 MG/ML IJ SOLN: 12.5000 mg | Freq: Four times a day (QID) | INTRAMUSCULAR | Status: DC | PRN

## 2021-08-05 MED ORDER — ONDANSETRON HCL 4 MG/2ML IJ SOLN
INTRAMUSCULAR | Status: AC
  Filled 2021-08-05: qty 2

## 2021-08-05 MED ORDER — BUPIVACAINE HCL 0.25 % IJ SOLN
INTRAMUSCULAR | Status: AC
  Filled 2021-08-05: qty 1

## 2021-08-05 MED ORDER — DIPHENHYDRAMINE HCL 12.5 MG/5ML PO ELIX: 12.5000 mg | ORAL_SOLUTION | Freq: Four times a day (QID) | ORAL | Status: DC | PRN

## 2021-08-05 MED ORDER — HYDROMORPHONE HCL 1 MG/ML IJ SOLN
INTRAMUSCULAR | Status: AC
  Administered 2021-08-05: 0.25 mg via INTRAVENOUS
  Filled 2021-08-05: qty 1

## 2021-08-05 MED ORDER — ENOXAPARIN SODIUM 40 MG/0.4ML IJ SOSY
40.0000 mg | PREFILLED_SYRINGE | INTRAMUSCULAR | Status: DC
  Administered 2021-08-06 – 2021-08-10 (×5): 40 mg via SUBCUTANEOUS
  Filled 2021-08-05 (×5): qty 0.4

## 2021-08-05 MED ORDER — MORPHINE SULFATE (PF) 2 MG/ML IV SOLN
2.0000 mg | INTRAVENOUS | Status: DC | PRN
  Administered 2021-08-05 – 2021-08-06 (×2): 2 mg via INTRAVENOUS
  Filled 2021-08-05 (×2): qty 1

## 2021-08-05 MED ORDER — FENTANYL CITRATE (PF) 250 MCG/5ML IJ SOLN
INTRAMUSCULAR | Status: AC
  Filled 2021-08-05: qty 5

## 2021-08-05 SURGICAL SUPPLY — 48 items
APL PRP STRL LF DISP 70% ISPRP (MISCELLANEOUS) ×2
BAG COUNTER SPONGE SURGICOUNT (BAG) IMPLANT
BAG SPNG CNTER NS LX DISP (BAG)
BLADE EXTENDED COATED 6.5IN (ELECTRODE) IMPLANT
CHLORAPREP W/TINT 26 (MISCELLANEOUS) ×4 IMPLANT
COVER SURGICAL LIGHT HANDLE (MISCELLANEOUS) ×4 IMPLANT
DECANTER SPIKE VIAL GLASS SM (MISCELLANEOUS) ×1 IMPLANT
DRSG OPSITE POSTOP 4X10 (GAUZE/BANDAGES/DRESSINGS) IMPLANT
DRSG OPSITE POSTOP 4X6 (GAUZE/BANDAGES/DRESSINGS) IMPLANT
DRSG OPSITE POSTOP 4X8 (GAUZE/BANDAGES/DRESSINGS) ×1 IMPLANT
DRSG PAD ABDOMINAL 8X10 ST (GAUZE/BANDAGES/DRESSINGS) IMPLANT
ELECT BLADE INSULATED 4IN (ELECTROSURGICAL) ×2
ELECT REM PT RETURN 15FT ADLT (MISCELLANEOUS) ×2 IMPLANT
ELECTRODE BLADE INSULATED 4IN (ELECTROSURGICAL) IMPLANT
GAUZE SPONGE 4X4 12PLY STRL (GAUZE/BANDAGES/DRESSINGS) IMPLANT
GLOVE SURG ORTHO LTX SZ8 (GLOVE) ×4 IMPLANT
GLOVE SURG SYN 7.5  E (GLOVE) ×2
GLOVE SURG SYN 7.5 E (GLOVE) ×1 IMPLANT
GLOVE SURG SYN 7.5 PF PI (GLOVE) ×1 IMPLANT
GOWN STRL REUS W/TWL XL LVL3 (GOWN DISPOSABLE) ×8 IMPLANT
HANDLE SUCTION POOLE (INSTRUMENTS) IMPLANT
KIT TURNOVER KIT A (KITS) IMPLANT
PACK COLON (CUSTOM PROCEDURE TRAY) ×2 IMPLANT
PENCIL SMOKE EVACUATOR (MISCELLANEOUS) IMPLANT
RELOAD PROXIMATE 75MM BLUE (ENDOMECHANICALS) ×4 IMPLANT
RELOAD STAPLE 75 3.8 BLU REG (ENDOMECHANICALS) IMPLANT
SPONGE T-LAP 18X18 ~~LOC~~+RFID (SPONGE) IMPLANT
STAPLER GUN LINEAR PROX 60 (STAPLE) ×1 IMPLANT
STAPLER PROXIMATE 75MM BLUE (STAPLE) ×1 IMPLANT
STAPLER VISISTAT 35W (STAPLE) ×3 IMPLANT
SUCTION POOLE HANDLE (INSTRUMENTS)
SUT NOV 1 T60/GS (SUTURE) IMPLANT
SUT NOVA 1 T20/GS 25DT (SUTURE) ×3 IMPLANT
SUT NOVA NAB DX-16 0-1 5-0 T12 (SUTURE) IMPLANT
SUT NOVA T20/GS 25 (SUTURE) IMPLANT
SUT PDS AB 1 TP1 96 (SUTURE) IMPLANT
SUT SILK 2 0 (SUTURE) ×4
SUT SILK 2 0 SH CR/8 (SUTURE) ×2 IMPLANT
SUT SILK 2 0SH CR/8 30 (SUTURE) IMPLANT
SUT SILK 2-0 18XBRD TIE 12 (SUTURE) ×1 IMPLANT
SUT SILK 2-0 30XBRD TIE 12 (SUTURE) ×1 IMPLANT
SUT SILK 3 0 (SUTURE) ×2
SUT SILK 3 0 SH CR/8 (SUTURE) ×2 IMPLANT
SUT SILK 3-0 18XBRD TIE 12 (SUTURE) ×1 IMPLANT
TOWEL OR 17X26 10 PK STRL BLUE (TOWEL DISPOSABLE) IMPLANT
TOWEL OR NON WOVEN STRL DISP B (DISPOSABLE) ×4 IMPLANT
TRAY FOLEY MTR SLVR 16FR STAT (SET/KITS/TRAYS/PACK) ×2 IMPLANT
TUBING CONNECTING 10 (TUBING) ×4 IMPLANT

## 2021-08-05 NOTE — Progress Notes (Signed)
PROGRESS NOTE    [name]  YWV:[id number] DOB: [birth year] DOA: 08/01/2021 PCP: Simona Huh, NP    Chief Complaint  Patient presents with   Abdominal Pain   Fall    Brief Narrative:  [name] is [name] 80 yo female with PMH afib, pseudophakia both eyes, hemorrhoids who presented to the ER with mild abdominal discomfort for at least the past 3 weeks.  She did have 1 episode of bloody stools just prior to admission.     Patient reports 73-month history of right lower quadrant abdominal pain and generalized weakness in the morning after she wakes up.  Also episodes of nausea but no vomiting.  Symptoms usually improve after she eats something.  Also had Gracia Saggese few episodes of dark stools.  Reports lightheadedness in the morning and also dyspnea with exertion.  Patient states she had bloody stools 3 months ago which she thought were due to her eating Mirka Barbone lot of popcorn.  Her PCP recommended colonoscopy but it has not been done yet.   She thinks she had Delmo Matty colonoscopy several years ago but cannot recall when or findings.    CT abdomen/pelvis was performed on admission which showed no acute findings. Hemoglobin was noted to be 7.4 g/dL.  No recent labs in epic for comparison.  Last noted labs are from 2011 at which time hemoglobin was 11 to 12 g/dL. She was transfused 1 unit PRBC on admission.   She underwent colonoscopy and EGD on 08/03/2021.  Colonoscopy was notable for an infiltrative and nonobstructing cecal mass.  General surgery was then consulted for further evaluation.  Patient was planned for undergoing right hemicolectomy on 08/05/2021. 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[place]) Active Problems:   Symptomatic anemia   GI bleed   Abnormal urinalysis  * Adenocarcinoma of cecum ([facility]) Initially presented with symptomatic anemia and RLQ pain On colonoscopy, noted to  have infiltrative, ulcerated, nonobstructing cecal mass on 11/14 Path with invasive moderately differentiated adenocarcinoma Now s/p open right colectomy 11/16 CT chest without contrast without evidence of metastatic disease CT abd pelvis without contrast without acute findings    Symptomatic anemia Hb on admit 7.4 S/p 2 units prbc Trend and transfuse as needed 2/2 cecal mass as noted above Continue PPI  GI bleed 2/2 adenocarcinoma of cecum  Abnormal urinalysis Workup further if symptomatic  DVT prophylaxis: lovenox Code Status: full Family Communication: daughters at bedside Disposition:   Status is: Inpatient  Remains inpatient appropriate because: continued need for post op care       Consultants:  Surgery GI  Procedures:  open right colectomy 11/16 Colonoscopy, EGD 11/14  Antimicrobials:  Anti-infectives (From admission, onward)    Start     Dose/Rate Route Frequency Ordered Stop   08/05/21 1045  ertapenem (INVANZ) 1,000 mg in sodium chloride 0.9 % 100 mL IVPB        1 g 200 mL/hr over 30 Minutes Intravenous On call to O.R. 08/04/21 1046 08/05/21 1220   08/05/21 0800  metroNIDAZOLE (FLAGYL) tablet 1,000 mg        1,000 mg Oral Once 08/05/21 0714 08/05/21 0751   08/05/21 0800  erythromycin (E-MYCIN) tablet 1,000 mg        1,000 mg Oral Once 08/05/21 0714 08/05/21 0751   08/04/21 1400  neomycin (MYCIFRADIN) tablet 1,000 mg  Status:  Discontinued       See Hyperspace for  full Linked Orders Report.   1,000 mg Oral 3 times per day 08/05/21 0713 08/05/21 0714   08/04/21 1400  metroNIDAZOLE (FLAGYL) tablet 1,000 mg  Status:  Discontinued       See Hyperspace for full Linked Orders Report.   1,000 mg Oral 3 times per day 08/05/21 0713 08/05/21 0714          Subjective: Sleeping post op, c/o pain - then goes to sleep  Objective: Vitals:   08/05/21 1542 08/05/21 1642 08/05/21 1742 08/05/21 1840  BP: (!) 149/67 (!) 154/72 (!) 150/72 (!) 152/71  Pulse:  71 71 76 76  Resp: 16 16 16 16   Temp: (!) 97.5 F (36.4 C) 97.7 F (36.5 C) (!) 97.5 F (36.4 C) (!) 97.5 F (36.4 C)  TempSrc: Oral Oral Oral Oral  SpO2: 95% 98% 97% 95%  Weight:      Height:        Intake/Output Summary (Last 24 hours) at 08/05/2021 2044 Last data filed at 08/05/2021 2000 Gross per 24 hour  Intake 1302.5 ml  Output 950 ml  Net 352.5 ml   Filed Weights   08/02/21 0034 08/03/21 1225 08/05/21 1017  Weight: 85.7 kg 85.7 kg 85.7 kg    Examination:  General exam: Appears calm and comfortable  Respiratory system: unlabored Cardiovascular system: RRR Gastrointestinal system: midline abdominal incision with dressing intact Central nervous system: sleepy post op Extremities: no LEE Skin: No rashes, lesions or ulcers Psychiatry: Judgement and insight appear normal. Mood & affect appropriate.     Data Reviewed: I have personally reviewed following labs and imaging studies  CBC: Recent Labs  Lab 08/01/21 1337 08/02/21 0440 08/03/21 0403 08/04/21 0411 08/05/21 0425  WBC 6.4  --  4.7 4.5 4.6  NEUTROABS 3.6  --   --   --   --   HGB 7.4* 8.1* 8.1* 8.1* 9.7*  HCT 25.5* 26.6* 27.5* 26.5* 30.6*  MCV 73.3*  --  76.2* 75.7* 76.7*  PLT 408*  --  357 328 213    Basic Metabolic Panel: Recent Labs  Lab 08/01/21 1337 08/03/21 0403 08/04/21 0411 08/05/21 0425  NA 136 139 140 140  K 4.2 3.9 3.9 3.5  CL 106 108 109 109  CO2 23 23 24 25   GLUCOSE 217* 136* 137* 121*  BUN 11 8 6* 6*  CREATININE 0.73 0.59 0.62 0.70  CALCIUM 9.1 8.9 8.9 8.9  MG  --  2.2 2.2 1.9    GFR: Estimated Creatinine Clearance: 62.9 mL/min (by C-G formula based on SCr of 0.7 mg/dL).  Liver Function Tests: Recent Labs  Lab 08/01/21 1337  AST 17  ALT 12  ALKPHOS 76  BILITOT 0.5  PROT 7.4  ALBUMIN 3.6    CBG: No results for input(s): GLUCAP in the last 168 hours.   Recent Results (from the past 240 hour(s))  Urine Culture     Status: None   Collection Time: 08/01/21   6:24 PM   Specimen: Urine, Clean Catch  Result Value Ref Range Status   Specimen Description   Final    URINE, CLEAN CATCH Performed at [facility], [place] [address], [place], [place] [address]    Special Requests   Final    NONE Performed at [facility], [place] [address] [place], [place] [address]    Culture   Final    NO GROWTH Performed at [facility], [place] [address], [place], [place] [address]  Report Status 08/03/2021 FINAL  Final  Resp Panel by RT-PCR (Flu Maximiano Lott&B, Covid) Nasopharyngeal Swab     Status: None   Collection Time: 08/01/21  7:00 PM   Specimen: Nasopharyngeal Swab; Nasopharyngeal(NP) swabs in vial transport medium  Result Value Ref Range Status   SARS Coronavirus 2 by RT PCR NEGATIVE NEGATIVE Final    Comment: (NOTE) SARS-CoV-2 target nucleic acids are NOT DETECTED.  The SARS-CoV-2 RNA is generally detectable in upper respiratory specimens during the acute phase of infection. The lowest concentration of SARS-CoV-2 viral copies this assay can detect is 138 copies/mL. Bradden Tadros negative result does not preclude SARS-Cov-2 infection and should not be used as the sole basis for treatment or other patient management decisions. Yovani Cogburn negative result may occur with  improper specimen collection/handling, submission of specimen other than nasopharyngeal swab, presence of viral mutation(s) within the areas targeted by this assay, and inadequate number of viral copies(<138 copies/mL). Donavon Kimrey negative result must be combined with clinical observations, patient history, and epidemiological information. The expected result is Negative.  Fact Sheet for Patients:  EntrepreneurPulse.com.au  Fact Sheet for Healthcare Providers:  IncredibleEmployment.be  This test is no t yet approved or cleared by the [place] FDA and  has been authorized for detection and/or diagnosis of SARS-CoV-2 by FDA under an  Emergency Use Authorization (EUA). This EUA will remain  in effect (meaning this test can be used) for the duration of the COVID-19 declaration under Section 564(b)(1) of the Act, 21 U.S.C.section 360bbb-3(b)(1), unless the authorization is terminated  or revoked sooner.       Influenza Abdulahi Schor by PCR NEGATIVE NEGATIVE Final   Influenza B by PCR NEGATIVE NEGATIVE Final    Comment: (NOTE) The Xpert Xpress SARS-CoV-2/FLU/RSV plus assay is intended as an aid in the diagnosis of influenza from Nasopharyngeal swab specimens and should not be used as Sue Fernicola sole basis for treatment. Nasal washings and aspirates are unacceptable for Xpert Xpress SARS-CoV-2/FLU/RSV testing.  Fact Sheet for Patients: EntrepreneurPulse.com.au  Fact Sheet for Healthcare Providers: IncredibleEmployment.be  This test is not yet approved or cleared by the [place] FDA and has been authorized for detection and/or diagnosis of SARS-CoV-2 by FDA under an Emergency Use Authorization (EUA). This EUA will remain in effect (meaning this test can be used) for the duration of the COVID-19 declaration under Section 564(b)(1) of the Act, 21 U.S.C. section 360bbb-3(b)(1), unless the authorization is terminated or revoked.  Performed at [facility], [place] [address], [place], [place] [address]          Radiology Studies: CT CHEST WO CONTRAST  Result Date: 08/04/2021 CLINICAL DATA:  Colorectal cancer, evaluate for meds EXAM: CT CHEST WITHOUT CONTRAST TECHNIQUE: Multidetector CT imaging of the chest was performed following the standard protocol without IV contrast. COMPARISON:  Chest radiograph 06/21/2020 FINDINGS: Cardiovascular: The heart is not enlarged. There is no pericardial effusion. There are mild mitral annular calcifications. There is mild calcified atherosclerotic plaque of the thoracic aorta. Mediastinum/Nodes: The imaged thyroid is unremarkable. The esophagus  is grossly unremarkable. There is Evangelos Paulino small hiatal hernia. There is no mediastinal or axillary lymphadenopathy. There is no bulky hilar lymphadenopathy. Lungs/Pleura: The trachea and central airways are patent. The lungs are well inflated. There is no focal consolidation or pulmonary edema. There is no pleural effusion or pneumothorax. There is minimal scarring in the left lung base and right lower lobe. There are no suspicious nodules or masses. Upper Abdomen: Kyrese Gartman small cyst is again seen in  the liver. The imaged portions of the upper abdominal viscera are otherwise unremarkable. Musculoskeletal: There is no acute osseous abnormality or aggressive osseous lesion. IMPRESSION: No evidence of metastatic disease or pathologic lymphadenopathy in the chest. Electronically Signed   By: Valetta Mole M.D.   On: 08/04/2021 15:47        Scheduled Meds:  acetaminophen  1,000 mg Oral Q6H   [START ON 08/06/2021] alvimopan  12 mg Oral BID   [START ON 08/06/2021] enoxaparin (LOVENOX) injection  40 mg Subcutaneous Q24H   feeding supplement  1 Container Oral TID BM   [START ON 08/06/2021] feeding supplement  237 mL Oral BID BM   [START ON 08/06/2021] feeding supplement  237 mL Oral BID BM   pantoprazole (PROTONIX) IV  40 mg Intravenous Q12H   Continuous Infusions:  sodium chloride     dextrose 5 % and 0.45 % NaCl with KCl 20 mEq/L 75 mL/hr at 08/05/21 1730     LOS: 2 days    Time spent: over 30 min    Fayrene Helper, MD [facility]   To contact the attending provider between 7A-7P or the covering provider during after hours 7P-7A, please log into the web site [email or web address] and access using universal [place] password for that web site. If you do not have the password, please call the hospital operator.  08/05/2021, 8:44 PM

## 2021-08-05 NOTE — Transfer of Care (Signed)
Immediate Anesthesia Transfer of Care Note  Patient: [name]  Procedure(s) Performed: RIGHT COLECTOMY (Right: Abdomen)  Patient Location: PACU  Anesthesia Type:General  Level of Consciousness: awake  Airway & Oxygen Therapy: Patient Spontanous Breathing and Patient connected to face mask oxygen  Post-op Assessment: Report given to RN and Post -op Vital signs reviewed and stable  Post vital signs: Reviewed and stable  Last Vitals:  Vitals Value Taken Time  BP 133/67 08/05/21 1340  Temp    Pulse 61 08/05/21 1341  Resp 14 08/05/21 1341  SpO2 100 % 08/05/21 1341  Vitals shown include unvalidated device data.  Last Pain:  Vitals:   08/05/21 1017  TempSrc:   PainSc: 0-No pain         Complications: No notable events documented.

## 2021-08-05 NOTE — Op Note (Signed)
Operative Note  Pre-operative Diagnosis:  cecal mass, adenocarcinoma  Post-operative Diagnosis:  same  Surgeon:  Armandina Gemma, MD  Assistant:  Alferd Apa, PA-C   Procedure:  open right colectomy  Anesthesia:  general  Estimated Blood Loss:  minimal  Drains: none         Specimen: right colon to pathology  Indications: Patient is a 80 year old female admitted with acute blood loss anemia.  Colonoscopy demonstrated a cecal mass.  Biopsies were taken and confirmed adenocarcinoma.  Patient now comes to surgery for right colectomy.  Procedure:  The patient was seen in the pre-op holding area. The risks, benefits, complications, treatment options, and expected outcomes were previously discussed with the patient. The patient agreed with the proposed plan and has signed the informed consent form.  The patient was brought to the operating room by the surgical team, identified as [name] and the procedure verified. A "time out" was completed and the above information confirmed.  Following administration of general anesthesia, the patient was positioned and then prepped and draped in usual aseptic fashion.  After ascertaining that an adequate level of anesthesia was achieved, a midline abdominal incision was made of a #10 blade.  Dissection was carried through subcutaneous tissues and hemostasis achieved with the electrocautery.  Fascia was incised in the midline and the peritoneal cavity was entered cautiously.  Abdomen was explored.  Liver was normal to palpation.  Orogastric tube was properly positioned within the stomach.  Palpation in the pelvis revealed no abnormal masses.  Foley balloon was present within the bladder.  Small bowel appeared grossly normal.  There was a palpable mass measuring approximately 4 cm in diameter in the cecum.  Terminal ileum was mobilized by incising the peritoneal attachments.  Dissection was carried along the lateral gutter incising the peritoneum  and allowing for mobilization of the ascending colon.  Hepatic flexure was mobilized dividing the attachments with the LigaSure.  Care was taken to avoid the sweep of the duodenum posteriorly.  Dissection was carried into the proximal transverse colon.  The entire right colon mesentery was thus elevated.  A point in the proximal transverse colon was selected and transected with a GIA stapler.  Mesentery was divided with the LigaSure.  A point in the terminal ileum was selected.  An opening was made through the mesentery at the edge of the bowel and the bowel transected with a GIA stapler.  The mesentery was divided with the LigaSure.  The right colic artery was clamped with a Kelly clamp and divided above the clamp with the LigaSure.  The artery was then suture-ligated with a 2-0 silk suture ligature.  Specimen was passed off the field and submitted to pathology.  Mesenteric defect was closed with interrupted 2-0 silk sutures.  The terminal ileum and the proximal transverse colon were then positioned side-by-side with interrupted 2-0 silk sutures.  Next a side-to-side functionally end-to-end anastomosis was created using a GIA stapler.  Staple line was inspected for hemostasis.  Enterotomy was closed with a TA 60 stapler.  The remaining mesenteric defect was closed with interrupted 3-0 silk sutures.  Next gowns, gloves, and drapes and instruments were all exchanged as per protocol.  Abdomen was irrigated with warm saline which was evacuated.  Good hemostasis was noted throughout the operative field.  Omentum was positioned over the anastomosis and beneath the midline incision.  Fascia was closed in the midline with interrupted #1 Novafil simple sutures.  Subcutaneous tissues were irrigated.  Local  anesthetic with Exparel and Marcaine was infiltrated into the fascial plane circumferentially.  Skin edges were reapproximated with stainless steel staples.  Skin edges were anesthetized with Exparel and Marcaine  infiltrated circumferentially.  Wound was washed and dried and a honeycomb dressing was applied.  Patient was awakened from anesthesia and transported to the recovery room in stable condition.  The patient tolerated the procedure well.   Armandina Gemma, [facility] Office: [phone]

## 2021-08-05 NOTE — Progress Notes (Signed)
Spoke to [facility] and received report for surgery.

## 2021-08-05 NOTE — Anesthesia Preprocedure Evaluation (Addendum)
Anesthesia Evaluation  Patient identified by MRN, date of birth, ID band Patient awake    Reviewed: Allergy & Precautions, NPO status , Patient's Chart, lab work & pertinent test results  History of Anesthesia Complications Negative for: history of anesthetic complications  Airway Mallampati: I  TM Distance: >3 FB Neck ROM: Full    Dental  (+) Partial Upper, Dental Advisory Given   Pulmonary neg pulmonary ROS,    breath sounds clear to auscultation       Cardiovascular negative cardio ROS   Rhythm:Regular Rate:Normal     Neuro/Psych negative neurological ROS     GI/Hepatic negative GI ROS, Neg liver ROS, AdenoCa cecum   Endo/Other  obese  Renal/GU negative Renal ROS     Musculoskeletal   Abdominal (+) + obese,   Peds  Hematology  (+) Blood dyscrasia (Hb 9.7), anemia ,   Anesthesia Other Findings   Reproductive/Obstetrics                            Anesthesia Physical Anesthesia Plan  ASA: 3  Anesthesia Plan: General   Post-op Pain Management:    Induction: Intravenous  PONV Risk Score and Plan: 3 and Ondansetron, Dexamethasone and Treatment may vary due to age or medical condition  Airway Management Planned: Oral ETT  Additional Equipment: None  Intra-op Plan:   Post-operative Plan: Extubation in OR  Informed Consent: I have reviewed the patients History and Physical, chart, labs and discussed the procedure including the risks, benefits and alternatives for the proposed anesthesia with the patient or authorized representative who has indicated his/her understanding and acceptance.     Dental advisory given  Plan Discussed with: CRNA and Surgeon  Anesthesia Plan Comments:        Anesthesia Quick Evaluation

## 2021-08-05 NOTE — Assessment & Plan Note (Addendum)
Initially presented with symptomatic anemia and RLQ pain On colonoscopy, noted to have infiltrative, ulcerated, nonobstructing cecal mass on 11/14 Path with invasive moderately differentiated adenocarcinoma Now s/p open right colectomy 11/16 Follow surgical path -> invasive colorectal adenocarcinoma, tubular adenoma, unremarkable proximal small intestinal and distal colonic resection margins  CT chest without contrast without evidence of metastatic disease CT abd pelvis without contrast without acute findings  Appreciate surgery assistance, advancing to soft diet  Discussed with Dr. Lorenso Courier 11/18 from oncology, he noted he'll arrange outpatient oncology follow up

## 2021-08-05 NOTE — Assessment & Plan Note (Signed)
Workup further if symptomatic

## 2021-08-05 NOTE — Assessment & Plan Note (Signed)
2/2 adenocarcinoma of cecum

## 2021-08-05 NOTE — Anesthesia Procedure Notes (Signed)
Procedure Name: Intubation Date/Time: 08/05/2021 11:50 AM Performed by: Lieutenant Diego, CRNA Pre-anesthesia Checklist: Patient identified, Emergency Drugs available, Suction available and Patient being monitored Patient Re-evaluated:Patient Re-evaluated prior to induction Oxygen Delivery Method: Circle system utilized Preoxygenation: Pre-oxygenation with 100% oxygen Induction Type: IV induction Ventilation: Mask ventilation without difficulty Laryngoscope Size: Miller and 2 Grade View: Grade I Tube type: Oral Tube size: 7.0 mm Number of attempts: 1 Airway Equipment and Method: Stylet and Oral airway Placement Confirmation: ETT inserted through vocal cords under direct vision, positive ETCO2 and breath sounds checked- equal and bilateral Secured at: 23 cm Tube secured with: Tape Dental Injury: Teeth and Oropharynx as per pre-operative assessment

## 2021-08-05 NOTE — Assessment & Plan Note (Addendum)
Hb on admit 7.4 S/p 2 units prbc Trend and transfuse as needed 2/2 cecal mass as noted above

## 2021-08-05 NOTE — Anesthesia Postprocedure Evaluation (Signed)
Anesthesia Post Note  Patient: [name]  Procedure(s) Performed: RIGHT COLECTOMY (Right: Abdomen)     Patient location during evaluation: PACU Anesthesia Type: General Level of consciousness: sedated, oriented and patient cooperative Pain management: pain level controlled Vital Signs Assessment: post-procedure vital signs reviewed and stable Respiratory status: spontaneous breathing, nonlabored ventilation and respiratory function stable Cardiovascular status: blood pressure returned to baseline and stable Postop Assessment: no apparent nausea or vomiting (nausea improved) Anesthetic complications: no   No notable events documented.  Last Vitals:  Vitals:   08/05/21 1001 08/05/21 1340  BP: 134/66 133/67  Pulse: 66 62  Resp: 16 15  Temp:    SpO2: 100% 100%    Last Pain:  Vitals:   08/05/21 1340  TempSrc:   PainSc: 0-No pain                 Felix Pratt,E. Adreonna Yontz

## 2021-08-06 ENCOUNTER — Encounter (HOSPITAL_COMMUNITY): Payer: Self-pay | Admitting: Surgery

## 2021-08-06 DIAGNOSIS — E119 Type 2 diabetes mellitus without complications: Secondary | ICD-10-CM

## 2021-08-06 LAB — HEMOGLOBIN A1C
Hgb A1c MFr Bld: 7.5 % — ABNORMAL HIGH (ref 4.8–5.6)
Mean Plasma Glucose: 168.55 mg/dL

## 2021-08-06 LAB — CBC
HCT: 33.2 % — ABNORMAL LOW (ref 36.0–46.0)
Hemoglobin: 10.6 g/dL — ABNORMAL LOW (ref 12.0–15.0)
MCH: 24.3 pg — ABNORMAL LOW (ref 26.0–34.0)
MCHC: 31.9 g/dL (ref 30.0–36.0)
MCV: 76 fL — ABNORMAL LOW (ref 80.0–100.0)
Platelets: 320 10*3/uL (ref 150–400)
RBC: 4.37 MIL/uL (ref 3.87–5.11)
RDW: 19.3 % — ABNORMAL HIGH (ref 11.5–15.5)
WBC: 13 10*3/uL — ABNORMAL HIGH (ref 4.0–10.5)
nRBC: 0 % (ref 0.0–0.2)

## 2021-08-06 LAB — BASIC METABOLIC PANEL
Anion gap: 7 (ref 5–15)
BUN: 7 mg/dL — ABNORMAL LOW (ref 8–23)
CO2: 22 mmol/L (ref 22–32)
Calcium: 8.8 mg/dL — ABNORMAL LOW (ref 8.9–10.3)
Chloride: 104 mmol/L (ref 98–111)
Creatinine, Ser: 0.6 mg/dL (ref 0.44–1.00)
GFR, Estimated: >60 mL/min (ref >60)
Glucose, Bld: 258 mg/dL — ABNORMAL HIGH (ref 70–99)
Potassium: 3.7 mmol/L (ref 3.5–5.1)
Sodium: 133 mmol/L — ABNORMAL LOW (ref 135–145)

## 2021-08-06 LAB — GLUCOSE, CAPILLARY
Glucose-Capillary: 233 mg/dL — ABNORMAL HIGH (ref 70–99)
Glucose-Capillary: 221 mg/dL — ABNORMAL HIGH (ref 70–99)
Glucose-Capillary: 195 mg/dL — ABNORMAL HIGH (ref 70–99)
Glucose-Capillary: 174 mg/dL — ABNORMAL HIGH (ref 70–99)

## 2021-08-06 LAB — MAGNESIUM: Magnesium: 2 mg/dL (ref 1.7–2.4)

## 2021-08-06 MED ORDER — INSULIN ASPART 100 UNIT/ML IJ SOLN: 0.0000 [IU] | Freq: Every day | INTRAMUSCULAR | Status: DC

## 2021-08-06 MED ORDER — ACETAMINOPHEN 160 MG/5ML PO SOLN
1000.0000 mg | Freq: Four times a day (QID) | ORAL | Status: DC
  Administered 2021-08-06 – 2021-08-07 (×3): 1000 mg via ORAL
  Filled 2021-08-06 (×4): qty 40.6

## 2021-08-06 MED ORDER — INSULIN ASPART 100 UNIT/ML IJ SOLN
0.0000 [IU] | Freq: Three times a day (TID) | INTRAMUSCULAR | Status: DC
  Administered 2021-08-06: 18:00:00 2 [IU] via SUBCUTANEOUS
  Administered 2021-08-06: 12:00:00 3 [IU] via SUBCUTANEOUS
  Administered 2021-08-07: 1 [IU] via SUBCUTANEOUS
  Administered 2021-08-07: 3 [IU] via SUBCUTANEOUS
  Administered 2021-08-07: 1 [IU] via SUBCUTANEOUS
  Administered 2021-08-08: 3 [IU] via SUBCUTANEOUS
  Administered 2021-08-08 – 2021-08-09 (×2): 2 [IU] via SUBCUTANEOUS
  Administered 2021-08-09: 3 [IU] via SUBCUTANEOUS

## 2021-08-06 MED ORDER — PANTOPRAZOLE SODIUM 40 MG PO TBEC
40.0000 mg | DELAYED_RELEASE_TABLET | Freq: Two times a day (BID) | ORAL | Status: DC
  Administered 2021-08-06 – 2021-08-10 (×8): 40 mg via ORAL
  Filled 2021-08-06 (×8): qty 1

## 2021-08-06 NOTE — Assessment & Plan Note (Addendum)
a1c 7.5 Start metformin, follow outpatient

## 2021-08-06 NOTE — Evaluation (Signed)
Occupational Therapy Evaluation Patient Details Name: [name] MRN: [id number] DOB: 06/18/1941 Today's Date: 08/06/2021   History of Present Illness [name] with PMH afib, pseudophakia both eyes, hemorrhoids who presented to the ER with mild abdominal discomfort. Patient s/p open right colectomy.   Clinical Impression   [name] is a 80 year old woman s/p abdominal surgery who demonstrates ability to ambulate in room without a device and perform all ADL tasks. Patient reports pain is a 5/10. Patient educated on log roll technique and potentially needed AE. Patient and daughter verbalized understanding. Patient exhibited no balance deficits. Patient will have intermittent assistance of family at discharge for IADL management. No further OT needs at this time.      Recommendations for follow up therapy are one component of a multi-disciplinary discharge planning process, led by the attending physician.  Recommendations may be updated based on patient status, additional functional criteria and insurance authorization.   Follow Up Recommendations  No OT follow up    Assistance Recommended at Discharge None  Functional Status Assessment  Patient has not had a recent decline in their functional status  Equipment Recommendations  None recommended by OT    Recommendations for Other Services       Precautions / Restrictions Precautions Precautions: None Restrictions Weight Bearing Restrictions: No      Mobility Bed Mobility Overal bed mobility: Needs Assistance Bed Mobility: Rolling Rolling: Min assist         General bed mobility comments: min assist for tactile cues for correct log roll technique.    Transfers Overall transfer level: Independent                 General transfer comment: independent to ambulate in room.      Balance Overall balance assessment: No apparent balance deficits (not formally  assessed)                                         ADL either performed or assessed with clinical judgement   ADL Overall ADL's : Modified independent                                       General ADL Comments: use of figure four method to perform LB dressing. Able to stand for prolonged amount of time to perform grooming tasks.     Vision Patient Visual Report: No change from baseline       Perception     Praxis      Pertinent Vitals/Pain Pain Assessment: 0-10 Pain Score: 5  Pain Location: abdomen Pain Descriptors / Indicators: Sore Pain Intervention(s): Monitored during session     Hand Dominance     Extremity/Trunk Assessment Upper Extremity Assessment Upper Extremity Assessment: Overall WFL for tasks assessed   Lower Extremity Assessment Lower Extremity Assessment: Overall WFL for tasks assessed   Cervical / Trunk Assessment Cervical / Trunk Assessment: Normal   Communication Communication Communication: No difficulties   Cognition Arousal/Alertness: Awake/alert Behavior During Therapy: WFL for tasks assessed/performed Overall Cognitive Status: Within Functional Limits for tasks assessed  General Comments       Exercises     Shoulder Instructions      Home Living Family/patient expects to be discharged to:: Private residence Living Arrangements: Alone Available Help at Discharge: Family;Available PRN/intermittently Type of Home: House Home Access: Stairs to enter [employer] of Steps: 4 Entrance Stairs-Rails: Right Home Layout: One level               Home Equipment: None          Prior Functioning/Environment Prior Level of Function : Independent/Modified Independent                        OT Problem List: Pain      OT Treatment/Interventions:      OT Goals(Current goals can be found in the care plan section) Acute Rehab  OT Goals OT Goal Formulation: All assessment and education complete, DC therapy  OT Frequency:     Barriers to D/C:            Co-evaluation              AM-PAC OT "6 Clicks" Daily Activity     Outcome Measure Help from another person eating meals?: None Help from another person taking care of personal grooming?: None Help from another person toileting, which includes using toliet, bedpan, or urinal?: None Help from another person bathing (including washing, rinsing, drying)?: None Help from another person to put on and taking off regular upper body clothing?: None Help from another person to put on and taking off regular lower body clothing?: None 6 Click Score: 24   End of Session Nurse Communication:  (okay to see)  Activity Tolerance: Patient tolerated treatment well Patient left: in chair;with call bell/phone within reach;with family/visitor present  OT Visit Diagnosis: Pain                Time: 9833-8250 OT Time Calculation (min): 24 min Charges:  OT General Charges $OT Visit: 1 Visit OT Evaluation $OT Eval Low Complexity: 1 Low OT Treatments $Self Care/Home Management : 8-22 mins  Derl Barrow, OTR/L Acute Care Rehab Services  Office [phone] Pager: [phone]   Lenward Chancellor 08/06/2021, 9:12 AM

## 2021-08-06 NOTE — Progress Notes (Signed)
[facility] Progress Note  1 Day Post-Op  Subjective: CC:  NAEO. Two daughters are at bedside. Patient has been OOB in her room but not walked the halls. Pain overall controlled. Tolerated some liquids at dinner last night. Reports some flatus and a BM today. Had one episode of emesis immediately after tylenol and drinking a small amt of boost breeze- patient feels maybe it was related to how sweet the boost was. Denies nausea currently, denies nausea last night.   Objective: Vital signs in last 24 hours: Temp:  [97.5 F (36.4 C)-99.1 F (37.3 C)] 98.3 F (36.8 C) (11/17 0550) Pulse Rate:  [58-76] 73 (11/17 0550) Resp:  [13-24] 18 (11/17 0550) BP: (133-156)/(55-72) 155/69 (11/17 0550) SpO2:  [95 %-100 %] 97 % (11/17 0550) Weight:  [85.7 kg] 85.7 kg (11/16 1017) Last BM Date: 08/05/21  Intake/Output from previous day: 11/16 0701 - 11/17 0700 In: 1760 [I.V.:1660; IV Piggyback:100] Out: 2250 [Urine:2225; Blood:25] Intake/Output this shift: No intake/output data recorded.  PE: Gen:  Alert, NAD, pleasant Card:  Regular rate and rhythm, pedal pulses 2+ BL Abd: Soft, appropriately tender, midline incisions with small amt dried blood on dressing, +BS Skin: warm and dry, no rashes  Psych: A&Ox3   Lab Results:  Recent Labs    08/05/21 0425 08/06/21 0415  WBC 4.6 13.0*  HGB 9.7* 10.6*  HCT 30.6* 33.2*  PLT 332 320   BMET Recent Labs    08/05/21 0425 08/06/21 0415  NA 140 133*  K 3.5 3.7  CL 109 104  CO2 25 22  GLUCOSE 121* 258*  BUN 6* 7*  CREATININE 0.70 0.60  CALCIUM 8.9 8.8*   PT/INR No results for input(s): LABPROT, INR in the last 72 hours. CMP     Component Value Date/Time   NA 133 (L) 08/06/2021 0415   K 3.7 08/06/2021 0415   CL 104 08/06/2021 0415   CO2 22 08/06/2021 0415   GLUCOSE 258 (H) 08/06/2021 0415   BUN 7 (L) 08/06/2021 0415   CREATININE 0.60 08/06/2021 0415   CALCIUM 8.8 (L) 08/06/2021 0415   PROT 7.4 08/01/2021 1337    ALBUMIN 3.6 08/01/2021 1337   AST 17 08/01/2021 1337   ALT 12 08/01/2021 1337   ALKPHOS 76 08/01/2021 1337   BILITOT 0.5 08/01/2021 1337   GFRNONAA >60 08/06/2021 0415   GFRAA  10/05/2007 2100    >60        The eGFR has been calculated using the MDRD equation. This calculation has not been validated in all clinical   Lipase     Component Value Date/Time   LIPASE 32 08/01/2021 1337       Studies/Results: CT CHEST WO CONTRAST  Result Date: 08/04/2021 CLINICAL DATA:  Colorectal cancer, evaluate for meds EXAM: CT CHEST WITHOUT CONTRAST TECHNIQUE: Multidetector CT imaging of the chest was performed following the standard protocol without IV contrast. COMPARISON:  Chest radiograph 06/21/2020 FINDINGS: Cardiovascular: The heart is not enlarged. There is no pericardial effusion. There are mild mitral annular calcifications. There is mild calcified atherosclerotic plaque of the thoracic aorta. Mediastinum/Nodes: The imaged thyroid is unremarkable. The esophagus is grossly unremarkable. There is a small hiatal hernia. There is no mediastinal or axillary lymphadenopathy. There is no bulky hilar lymphadenopathy. Lungs/Pleura: The trachea and central airways are patent. The lungs are well inflated. There is no focal consolidation or pulmonary edema. There is no pleural effusion or pneumothorax. There is minimal scarring in the left lung base and right  lower lobe. There are no suspicious nodules or masses. Upper Abdomen: A small cyst is again seen in the liver. The imaged portions of the upper abdominal viscera are otherwise unremarkable. Musculoskeletal: There is no acute osseous abnormality or aggressive osseous lesion. IMPRESSION: No evidence of metastatic disease or pathologic lymphadenopathy in the chest. Electronically Signed   By: Valetta Mole M.D.   On: 08/04/2021 15:47    Anti-infectives: Anti-infectives (From admission, onward)    Start     Dose/Rate Route Frequency Ordered Stop    08/05/21 1045  ertapenem (INVANZ) 1,000 mg in sodium chloride 0.9 % 100 mL IVPB        1 g 200 mL/hr over 30 Minutes Intravenous On call to O.R. 08/04/21 1046 08/05/21 1220   08/05/21 0800  metroNIDAZOLE (FLAGYL) tablet 1,000 mg        1,000 mg Oral Once 08/05/21 0714 08/05/21 0751   08/05/21 0800  erythromycin (E-MYCIN) tablet 1,000 mg        1,000 mg Oral Once 08/05/21 0714 08/05/21 0751   08/04/21 1400  neomycin (MYCIFRADIN) tablet 1,000 mg  Status:  Discontinued       See Hyperspace for full Linked Orders Report.   1,000 mg Oral 3 times per day 08/05/21 0713 08/05/21 0714   08/04/21 1400  metroNIDAZOLE (FLAGYL) tablet 1,000 mg  Status:  Discontinued       See Hyperspace for full Linked Orders Report.   1,000 mg Oral 3 times per day 08/05/21 0713 08/05/21 0714        Assessment/Plan ABL anemia secondary to below  cecal adenocarcinoma s/p colonoscopy and biopsy11/14 Dr. Paulita Fujita POD#1 s/p open right colectomy 08/05/21 Dr. Armandina Gemma - afebrile, VSS, H&H stable  - having flatus and BMs this morning, also one episode of emesis after tylenol, continue full liquids. Likely advance to soft diet for dinner if no further nausea or signs of ileus.  - Mobilize/OOB - PRN analgesics and antiemetics - surgical pathology pending   FEN: FLD ID: ertapenem 11/16 periop VTE: SCD's, Lovenox Foley: none, voiding independently  Dispo: med-surg     LOS: 3 days    Obie Dredge, [facility] Please see Amion for pager number during day hours 7:00am-4:30pm

## 2021-08-06 NOTE — Progress Notes (Signed)
The patient is receiving Protonix by the intravenous route.  Based on criteria approved by the Pharmacy and [place], the medication is being converted to the equivalent oral dose form.  These criteria include: -No active GI bleeding -Able to tolerate diet of full liquids (or better) or tube feeding -Able to tolerate other medications by the oral or enteral route  If you have any questions about this conversion, please contact the Pharmacy Department (phone [phone])  Pharmacy Brief Note - Alvimopan (Entereg)  The standing order set for alvimopan (Entereg) now includes an automatic order to discontinue the drug after the patient has had a bowel movement. The change was approved by the [place] and the Medical Executive Committee.  This patient has had a bowel movement documented by nursing. Therefore, alvimopan has been discontinued. If there are questions, please contact the pharmacy at [phone].  Thank you

## 2021-08-06 NOTE — Progress Notes (Signed)
PT Cancellation Note  Patient Details Name: [name] MRN: [id number] DOB: Feb 10, 1941   Cancelled Treatment:     PT order received but eval deferred this date.  RN advises pt mobilizing independently and with no PT needs at this time.  PT services will sign off.   Kacee Sukhu 08/06/2021, 4:11 PM

## 2021-08-06 NOTE — Progress Notes (Signed)
PROGRESS NOTE    [name]  VQQ:[id number] DOB: 1941/04/04 DOA: 08/01/2021 PCP: Simona Huh, NP    Chief Complaint  Patient presents with   Abdominal Pain   Fall    Brief Narrative:  [name] is [name] 80 yo female with PMH afib, pseudophakia both eyes, hemorrhoids who presented to the ER with mild abdominal discomfort for at least the past 3 weeks.  She did have 1 episode of bloody stools just prior to admission.     Patient reports 72-month history of right lower quadrant abdominal pain and generalized weakness in the morning after she wakes up.  Also episodes of nausea but no vomiting.  Symptoms usually improve after she eats something.  Also had Contina Strain few episodes of dark stools.  Reports lightheadedness in the morning and also dyspnea with exertion.  Patient states she had bloody stools 3 months ago which she thought were due to her eating Baylen Dea lot of popcorn.  Her PCP recommended colonoscopy but it has not been done yet.   She thinks she had Decklyn Hornik colonoscopy several years ago but cannot recall when or findings.    CT abdomen/pelvis was performed on admission which showed no acute findings. Hemoglobin was noted to be 7.4 g/dL.  No recent labs in epic for comparison.  Last noted labs are from 2011 at which time hemoglobin was 11 to 12 g/dL. She was transfused 1 unit PRBC on admission.   She underwent colonoscopy and EGD on 08/03/2021.  Colonoscopy was notable for an infiltrative and nonobstructing cecal mass.  General surgery was then consulted for further evaluation.  Patient was planned for undergoing right hemicolectomy on 08/05/2021. 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[place]) Active Problems:   Symptomatic anemia   GI bleed   Abnormal urinalysis   Type 2 diabetes mellitus without complication (HCC)  * Adenocarcinoma of cecum (HCC) Initially presented with  symptomatic anemia and RLQ pain On colonoscopy, noted to have infiltrative, ulcerated, nonobstructing cecal mass on 11/14 Path with invasive moderately differentiated adenocarcinoma Now s/p open right colectomy 11/16 Follow surgical path CT chest without contrast without evidence of metastatic disease CT abd pelvis without contrast without acute findings  Will discuss timing of oncology c/s   Symptomatic anemia Hb on admit 7.4 S/p 2 units prbc Trend and transfuse as needed 2/2 cecal mass as noted above Continue PPI  GI bleed 2/2 adenocarcinoma of cecum  Abnormal urinalysis Workup further if symptomatic  Type 2 diabetes mellitus without complication (HCC) E3P 7.5 This maybe Eshaan Titzer new diagnosis?   DVT prophylaxis: lovenox Code Status: full Family Communication: daughters at bedside Disposition:   Status is: Inpatient  Remains inpatient appropriate because: continued need for post op care       Consultants:  Surgery GI  Procedures:  open right colectomy 11/16 Colonoscopy, EGD 11/14  Antimicrobials:  Anti-infectives (From admission, onward)    Start     Dose/Rate Route Frequency Ordered Stop   08/05/21 1045  ertapenem (INVANZ) 1,000 mg in sodium chloride 0.9 % 100 mL IVPB        1 g 200 mL/hr over 30 Minutes Intravenous On call to O.R. 08/04/21 1046 08/05/21 1220   08/05/21 0800  metroNIDAZOLE (FLAGYL) tablet 1,000 mg        1,000 mg Oral Once 08/05/21 0714 08/05/21 0751   08/05/21 0800  erythromycin (E-MYCIN) tablet 1,000 mg  1,000 mg Oral Once 08/05/21 0714 08/05/21 0751   08/04/21 1400  neomycin (MYCIFRADIN) tablet 1,000 mg  Status:  Discontinued       See Hyperspace for full Linked Orders Report.   1,000 mg Oral 3 times per day 08/05/21 0713 08/05/21 0714   08/04/21 1400  metroNIDAZOLE (FLAGYL) tablet 1,000 mg  Status:  Discontinued       See Hyperspace for full Linked Orders Report.   1,000 mg Oral 3 times per day 08/05/21 0713 08/05/21 0714           Subjective: Pain controlled today  Objective: Vitals:   08/05/21 2056 08/06/21 0130 08/06/21 0550 08/06/21 1000  BP: (!) 150/61 (!) 143/55 (!) 155/69 (!) 165/71  Pulse: 74 73 73 81  Resp: 18 18 18 18   Temp: 97.8 F (36.6 C) 99.1 F (37.3 C) 98.3 F (36.8 C) 98.6 F (37 C)  TempSrc: Oral Oral Oral Oral  SpO2: 96% 95% 97% 97%  Weight:      Height:        Intake/Output Summary (Last 24 hours) at 08/06/2021 1426 Last data filed at 08/06/2021 0618 Gross per 24 hour  Intake 960 ml  Output 2125 ml  Net -1165 ml   Filed Weights   08/02/21 0034 08/03/21 1225 08/05/21 1017  Weight: 85.7 kg 85.7 kg 85.7 kg    Examination:  General: No acute distress. Cardiovascular: RRR Lungs: unlabored Abdomen: intact surgical dressing, soft and appropriately tender Neurological: Alert and oriented 3. Moves all extremities 4 . Cranial nerves II through XII grossly intact. Skin: Warm and dry. No rashes or lesions. Extremities: No clubbing or cyanosis. No edema.   Data Reviewed: I have personally reviewed following labs and imaging studies  CBC: Recent Labs  Lab 08/01/21 1337 08/02/21 0440 08/03/21 0403 08/04/21 0411 08/05/21 0425 08/06/21 0415  WBC 6.4  --  4.7 4.5 4.6 13.0*  NEUTROABS 3.6  --   --   --   --   --   HGB 7.4* 8.1* 8.1* 8.1* 9.7* 10.6*  HCT 25.5* 26.6* 27.5* 26.5* 30.6* 33.2*  MCV 73.3*  --  76.2* 75.7* 76.7* 76.0*  PLT 408*  --  357 328 332 449    Basic Metabolic Panel: Recent Labs  Lab 08/01/21 1337 08/03/21 0403 08/04/21 0411 08/05/21 0425 08/06/21 0415  NA 136 139 140 140 133*  K 4.2 3.9 3.9 3.5 3.7  CL 106 108 109 109 104  CO2 23 23 24 25 22   GLUCOSE 217* 136* 137* 121* 258*  BUN 11 8 6* 6* 7*  CREATININE 0.73 0.59 0.62 0.70 0.60  CALCIUM 9.1 8.9 8.9 8.9 8.8*  MG  --  2.2 2.2 1.9 2.0    GFR: Estimated Creatinine Clearance: 62.9 mL/min (by C-G formula based on SCr of 0.6 mg/dL).  Liver Function Tests: Recent Labs  Lab  08/01/21 1337  AST 17  ALT 12  ALKPHOS 76  BILITOT 0.5  PROT 7.4  ALBUMIN 3.6    CBG: Recent Labs  Lab 08/06/21 0810 08/06/21 1144  GLUCAP 233* 221*     Recent Results (from the past 240 hour(s))  Urine Culture     Status: None   Collection Time: 08/01/21  6:24 PM   Specimen: Urine, Clean Catch  Result Value Ref Range Status   Specimen Description   Final    URINE, CLEAN CATCH Performed at [facility], [place] [address], [place],  [address]    Special Requests  Final    NONE Performed at [facility], [place] [address] [place], [place] [address]    Culture   Final    NO GROWTH Performed at [facility], [place] [address], [place], [place] [address]    Report Status 08/03/2021 FINAL  Final  Resp Panel by RT-PCR (Flu Valjean Ruppel&B, Covid) Nasopharyngeal Swab     Status: None   Collection Time: 08/01/21  7:00 PM   Specimen: Nasopharyngeal Swab; Nasopharyngeal(NP) swabs in vial transport medium  Result Value Ref Range Status   SARS Coronavirus 2 by RT PCR NEGATIVE NEGATIVE Final    Comment: (NOTE) SARS-CoV-2 target nucleic acids are NOT DETECTED.  The SARS-CoV-2 RNA is generally detectable in upper respiratory specimens during the acute phase of infection. The lowest concentration of SARS-CoV-2 viral copies this assay can detect is 138 copies/mL. Shin Lamour negative result does not preclude SARS-Cov-2 infection and should not be used as the sole basis for treatment or other patient management decisions. Sentoria Brent negative result may occur with  improper specimen collection/handling, submission of specimen other than nasopharyngeal swab, presence of viral mutation(s) within the areas targeted by this assay, and inadequate number of viral copies(<138 copies/mL). Alvita Fana negative result must be combined with clinical observations, patient history, and epidemiological information. The expected result is Negative.  Fact Sheet for Patients:   EntrepreneurPulse.com.au  Fact Sheet for Healthcare Providers:  IncredibleEmployment.be  This test is no t yet approved or cleared by the [place] FDA and  has been authorized for detection and/or diagnosis of SARS-CoV-2 by FDA under an Emergency Use Authorization (EUA). This EUA will remain  in effect (meaning this test can be used) for the duration of the COVID-19 declaration under Section 564(b)(1) of the Act, 21 U.S.C.section 360bbb-3(b)(1), unless the authorization is terminated  or revoked sooner.       Influenza Rowyn Spilde by PCR NEGATIVE NEGATIVE Final   Influenza B by PCR NEGATIVE NEGATIVE Final    Comment: (NOTE) The Xpert Xpress SARS-CoV-2/FLU/RSV plus assay is intended as an aid in the diagnosis of influenza from Nasopharyngeal swab specimens and should not be used as Lamees Gable sole basis for treatment. Nasal washings and aspirates are unacceptable for Xpert Xpress SARS-CoV-2/FLU/RSV testing.  Fact Sheet for Patients: EntrepreneurPulse.com.au  Fact Sheet for Healthcare Providers: IncredibleEmployment.be  This test is not yet approved or cleared by the [place] FDA and has been authorized for detection and/or diagnosis of SARS-CoV-2 by FDA under an Emergency Use Authorization (EUA). This EUA will remain in effect (meaning this test can be used) for the duration of the COVID-19 declaration under Section 564(b)(1) of the Act, 21 U.S.C. section 360bbb-3(b)(1), unless the authorization is terminated or revoked.  Performed at [facility], [place] [address] [place], [place] [address]          Radiology Studies: CT CHEST WO CONTRAST  Result Date: 08/04/2021 CLINICAL DATA:  Colorectal cancer, evaluate for meds EXAM: CT CHEST WITHOUT CONTRAST TECHNIQUE: Multidetector CT imaging of the chest was performed following the standard protocol without IV contrast. COMPARISON:  Chest  radiograph 06/21/2020 FINDINGS: Cardiovascular: The heart is not enlarged. There is no pericardial effusion. There are mild mitral annular calcifications. There is mild calcified atherosclerotic plaque of the thoracic aorta. Mediastinum/Nodes: The imaged thyroid is unremarkable. The esophagus is grossly unremarkable. There is Maretta Overdorf small hiatal hernia. There is no mediastinal or axillary lymphadenopathy. There is no bulky hilar lymphadenopathy. Lungs/Pleura: The trachea and central airways are patent. The lungs are  well inflated. There is no focal consolidation or pulmonary edema. There is no pleural effusion or pneumothorax. There is minimal scarring in the left lung base and right lower lobe. There are no suspicious nodules or masses. Upper Abdomen: Dima Ferrufino small cyst is again seen in the liver. The imaged portions of the upper abdominal viscera are otherwise unremarkable. Musculoskeletal: There is no acute osseous abnormality or aggressive osseous lesion. IMPRESSION: No evidence of metastatic disease or pathologic lymphadenopathy in the chest. Electronically Signed   By: Valetta Mole M.D.   On: 08/04/2021 15:47        Scheduled Meds:  acetaminophen  1,000 mg Oral Q6H   enoxaparin (LOVENOX) injection  40 mg Subcutaneous Q24H   feeding supplement  237 mL Oral BID BM   insulin aspart  0-5 Units Subcutaneous QHS   insulin aspart  0-9 Units Subcutaneous TID WC   pantoprazole  40 mg Oral BID   Continuous Infusions:  sodium chloride     dextrose 5 % and 0.45 % NaCl with KCl 20 mEq/L 75 mL/hr at 08/05/21 1730     LOS: 3 days    Time spent: over 30 min    Fayrene Helper, MD [facility]   To contact the attending provider between 7A-7P or the covering provider during after hours 7P-7A, please log into the web site [email or web address] and access using universal [place] password for that web site. If you do not have the password, please call the hospital operator.  08/06/2021, 2:26 PM

## 2021-08-07 LAB — BASIC METABOLIC PANEL
Anion gap: 7 (ref 5–15)
BUN: 9 mg/dL (ref 8–23)
CO2: 25 mmol/L (ref 22–32)
Calcium: 9 mg/dL (ref 8.9–10.3)
Chloride: 106 mmol/L (ref 98–111)
Creatinine, Ser: 0.64 mg/dL (ref 0.44–1.00)
GFR, Estimated: >60 mL/min (ref >60)
Glucose, Bld: 194 mg/dL — ABNORMAL HIGH (ref 70–99)
Potassium: 3.8 mmol/L (ref 3.5–5.1)
Sodium: 138 mmol/L (ref 135–145)

## 2021-08-07 LAB — CBC
HCT: 33.3 % — ABNORMAL LOW (ref 36.0–46.0)
Hemoglobin: 10.2 g/dL — ABNORMAL LOW (ref 12.0–15.0)
MCH: 23.4 pg — ABNORMAL LOW (ref 26.0–34.0)
MCHC: 30.6 g/dL (ref 30.0–36.0)
MCV: 76.6 fL — ABNORMAL LOW (ref 80.0–100.0)
Platelets: 326 10*3/uL (ref 150–400)
RBC: 4.35 MIL/uL (ref 3.87–5.11)
RDW: 20 % — ABNORMAL HIGH (ref 11.5–15.5)
WBC: 11.4 10*3/uL — ABNORMAL HIGH (ref 4.0–10.5)
nRBC: 0 % (ref 0.0–0.2)

## 2021-08-07 LAB — MAGNESIUM: Magnesium: 2.2 mg/dL (ref 1.7–2.4)

## 2021-08-07 LAB — GLUCOSE, CAPILLARY
Glucose-Capillary: 140 mg/dL — ABNORMAL HIGH (ref 70–99)
Glucose-Capillary: 233 mg/dL — ABNORMAL HIGH (ref 70–99)
Glucose-Capillary: 142 mg/dL — ABNORMAL HIGH (ref 70–99)
Glucose-Capillary: 171 mg/dL — ABNORMAL HIGH (ref 70–99)

## 2021-08-07 MED ORDER — ACETAMINOPHEN 160 MG/5ML PO SOLN
1000.0000 mg | Freq: Three times a day (TID) | ORAL | Status: DC
  Administered 2021-08-07: 1000 mg via ORAL
  Administered 2021-08-08 (×2): 650 mg via ORAL
  Administered 2021-08-08 – 2021-08-09 (×2): 1000 mg via ORAL
  Administered 2021-08-09: 650 mg via ORAL
  Filled 2021-08-07 (×6): qty 40.6

## 2021-08-07 NOTE — Progress Notes (Signed)
PROGRESS NOTE    [name]  ZTI:[id number] DOB: Aug 19, 1941 DOA: 08/01/2021 PCP: Simona Huh, NP    Chief Complaint  Patient presents with   Abdominal Pain   Fall    Brief Narrative:  [name] is [name] 80 yo female with PMH afib, pseudophakia both eyes, hemorrhoids who presented to the ER with mild abdominal discomfort for at least the past 3 weeks.  She did have 1 episode of bloody stools just prior to admission.     Patient reports 75-month history of right lower quadrant abdominal pain and generalized weakness in the morning after she wakes up.  Also episodes of nausea but no vomiting.  Symptoms usually improve after she eats something.  Also had Elishia Kaczorowski few episodes of dark stools.  Reports lightheadedness in the morning and also dyspnea with exertion.  Patient states she had bloody stools 3 months ago which she thought were due to her eating Rhett Mutschler lot of popcorn.  Her PCP recommended colonoscopy but it has not been done yet.   She thinks she had Eliana Lueth colonoscopy several years ago but cannot recall when or findings.    CT abdomen/pelvis was performed on admission which showed no acute findings. Hemoglobin was noted to be 7.4 g/dL.  No recent labs in epic for comparison.  Last noted labs are from 2011 at which time hemoglobin was 11 to 12 g/dL. She was transfused 1 unit PRBC on admission.   She underwent colonoscopy and EGD on 08/03/2021.  Colonoscopy was notable for an infiltrative and nonobstructing cecal mass.  General surgery was then consulted for further evaluation.  Patient was planned for undergoing right hemicolectomy on 08/05/2021. 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[place]) Active Problems:   Symptomatic anemia   GI bleed   Abnormal urinalysis   Type 2 diabetes mellitus without complication (HCC)  * Adenocarcinoma of cecum (HCC) Initially presented with  symptomatic anemia and RLQ pain On colonoscopy, noted to have infiltrative, ulcerated, nonobstructing cecal mass on 11/14 Path with invasive moderately differentiated adenocarcinoma Now s/p open right colectomy 11/16 Follow surgical path -> invasive colorectal adenocarcinoma, tubular adenoma, unremarkable proximal small intestinal and distal colonic resection margins  CT chest without contrast without evidence of metastatic disease CT abd pelvis without contrast without acute findings  Discussed with Dr. Lorenso Courier 11/18 from oncology, he noted he'll arrange outpatient oncology follow up   Symptomatic anemia Hb on admit 7.4 S/p 2 units prbc Trend and transfuse as needed 2/2 cecal mass as noted above Continue PPI  GI bleed 2/2 adenocarcinoma of cecum  Abnormal urinalysis Workup further if symptomatic  Type 2 diabetes mellitus without complication (HCC) A2N 7.5 This maybe Hondo Nanda new diagnosis?   DVT prophylaxis: lovenox Code Status: full Family Communication: daughters at bedside Disposition:   Status is: Inpatient  Remains inpatient appropriate because: continued need for post op care       Consultants:  Surgery GI  Procedures:  open right colectomy 11/16 Colonoscopy, EGD 11/14  Antimicrobials:  Anti-infectives (From admission, onward)    Start     Dose/Rate Route Frequency Ordered Stop   08/05/21 1045  ertapenem (INVANZ) 1,000 mg in sodium chloride 0.9 % 100 mL IVPB        1 g 200 mL/hr over 30 Minutes Intravenous On call to O.R. 08/04/21 1046 08/05/21 1220   08/05/21 0800  metroNIDAZOLE (FLAGYL) tablet 1,000 mg  1,000 mg Oral Once 08/05/21 0714 08/05/21 0751   08/05/21 0800  erythromycin (E-MYCIN) tablet 1,000 mg        1,000 mg Oral Once 08/05/21 0714 08/05/21 0751   08/04/21 1400  neomycin (MYCIFRADIN) tablet 1,000 mg  Status:  Discontinued       See Hyperspace for full Linked Orders Report.   1,000 mg Oral 3 times per day 08/05/21 0713 08/05/21 0714    08/04/21 1400  metroNIDAZOLE (FLAGYL) tablet 1,000 mg  Status:  Discontinued       See Hyperspace for full Linked Orders Report.   1,000 mg Oral 3 times per day 08/05/21 0713 08/05/21 0714          Subjective: No new complaints  Objective: Vitals:   08/06/21 1702 08/06/21 2206 08/07/21 0518 08/07/21 1343  BP: (!) 164/75 (!) 164/72 (!) 158/73 (!) 150/68  Pulse: 72 76 71 76  Resp: 18 16 16 14   Temp: 98.5 F (36.9 C) 98.9 F (37.2 C) 98.3 F (36.8 C) 97.7 F (36.5 C)  TempSrc: Oral Oral Oral Oral  SpO2: 99% 95% 95% 97%  Weight:   82.5 kg   Height:        Intake/Output Summary (Last 24 hours) at 08/07/2021 1839 Last data filed at 08/07/2021 1656 Gross per 24 hour  Intake 600 ml  Output 0 ml  Net 600 ml   Filed Weights   08/03/21 1225 08/05/21 1017 08/07/21 0518  Weight: 85.7 kg 85.7 kg 82.5 kg    Examination:  General: No acute distress. Cardiovascular: RRR Lungs: unlabored Abdomen: appropriately tender Neurological: Alert and oriented 3. Moves all extremities 4 . Cranial nerves II through XII grossly intact. Skin: Warm and dry. No rashes or lesions. Extremities: No clubbing or cyanosis. No edema.    Data Reviewed: I have personally reviewed following labs and imaging studies  CBC: Recent Labs  Lab 08/01/21 1337 08/02/21 0440 08/03/21 0403 08/04/21 0411 08/05/21 0425 08/06/21 0415 08/07/21 0401  WBC 6.4  --  4.7 4.5 4.6 13.0* 11.4*  NEUTROABS 3.6  --   --   --   --   --   --   HGB 7.4*   < > 8.1* 8.1* 9.7* 10.6* 10.2*  HCT 25.5*   < > 27.5* 26.5* 30.6* 33.2* 33.3*  MCV 73.3*  --  76.2* 75.7* 76.7* 76.0* 76.6*  PLT 408*  --  357 328 332 320 326   < > = values in this interval not displayed.    Basic Metabolic Panel: Recent Labs  Lab 08/03/21 0403 08/04/21 0411 08/05/21 0425 08/06/21 0415 08/07/21 0401  NA 139 140 140 133* 138  K 3.9 3.9 3.5 3.7 3.8  CL 108 109 109 104 106  CO2 23 24 25 22 25   GLUCOSE 136* 137* 121* 258* 194*  BUN 8 6*  6* 7* 9  CREATININE 0.59 0.62 0.70 0.60 0.64  CALCIUM 8.9 8.9 8.9 8.8* 9.0  MG 2.2 2.2 1.9 2.0 2.2    GFR: Estimated Creatinine Clearance: 61.8 mL/min (by C-G formula based on SCr of 0.64 mg/dL).  Liver Function Tests: Recent Labs  Lab 08/01/21 1337  AST 17  ALT 12  ALKPHOS 76  BILITOT 0.5  PROT 7.4  ALBUMIN 3.6    CBG: Recent Labs  Lab 08/06/21 1705 08/06/21 2202 08/07/21 0746 08/07/21 1145 08/07/21 1650  GLUCAP 195* 174* 140* 233* 142*     Recent Results (from the past 240 hour(s))  Urine Culture  Status: None   Collection Time: 08/01/21  6:24 PM   Specimen: Urine, Clean Catch  Result Value Ref Range Status   Specimen Description   Final    URINE, CLEAN CATCH Performed at [facility], [place] [address], [place], [place] [address]    Special Requests   Final    NONE Performed at [facility], [place] [address], [place], [place] [address]    Culture   Final    NO GROWTH Performed at [facility], [place] [address], [place], [place] [address]    Report Status 08/03/2021 FINAL  Final  Resp Panel by RT-PCR (Flu Ailis Rigaud&B, Covid) Nasopharyngeal Swab     Status: None   Collection Time: 08/01/21  7:00 PM   Specimen: Nasopharyngeal Swab; Nasopharyngeal(NP) swabs in vial transport medium  Result Value Ref Range Status   SARS Coronavirus 2 by RT PCR NEGATIVE NEGATIVE Final    Comment: (NOTE) SARS-CoV-2 target nucleic acids are NOT DETECTED.  The SARS-CoV-2 RNA is generally detectable in upper respiratory specimens during the acute phase of infection. The lowest concentration of SARS-CoV-2 viral copies this assay can detect is 138 copies/mL. Semiyah Newgent negative result does not preclude SARS-Cov-2 infection and should not be used as the sole basis for treatment or other patient management decisions. Krikor Willet negative result may occur with  improper specimen collection/handling, submission of specimen other than nasopharyngeal swab, presence of  viral mutation(s) within the areas targeted by this assay, and inadequate number of viral copies(<138 copies/mL). Tomasz Steeves negative result must be combined with clinical observations, patient history, and epidemiological information. The expected result is Negative.  Fact Sheet for Patients:  EntrepreneurPulse.com.au  Fact Sheet for Healthcare Providers:  IncredibleEmployment.be  This test is no t yet approved or cleared by the [place] FDA and  has been authorized for detection and/or diagnosis of SARS-CoV-2 by FDA under an Emergency Use Authorization (EUA). This EUA will remain  in effect (meaning this test can be used) for the duration of the COVID-19 declaration under Section 564(b)(1) of the Act, 21 U.S.C.section 360bbb-3(b)(1), unless the authorization is terminated  or revoked sooner.       Influenza Khai Torbert by PCR NEGATIVE NEGATIVE Final   Influenza B by PCR NEGATIVE NEGATIVE Final    Comment: (NOTE) The Xpert Xpress SARS-CoV-2/FLU/RSV plus assay is intended as an aid in the diagnosis of influenza from Nasopharyngeal swab specimens and should not be used as Joshiah Traynham sole basis for treatment. Nasal washings and aspirates are unacceptable for Xpert Xpress SARS-CoV-2/FLU/RSV testing.  Fact Sheet for Patients: EntrepreneurPulse.com.au  Fact Sheet for Healthcare Providers: IncredibleEmployment.be  This test is not yet approved or cleared by the [place] FDA and has been authorized for detection and/or diagnosis of SARS-CoV-2 by FDA under an Emergency Use Authorization (EUA). This EUA will remain in effect (meaning this test can be used) for the duration of the COVID-19 declaration under Section 564(b)(1) of the Act, 21 U.S.C. section 360bbb-3(b)(1), unless the authorization is terminated or revoked.  Performed at [facility], [place] [address] [place], [place] [address]           Radiology Studies: No results found.      Scheduled Meds:  acetaminophen (TYLENOL) oral liquid 160 mg/5 mL  1,000 mg Oral Q6H   enoxaparin (LOVENOX) injection  40 mg Subcutaneous Q24H   feeding supplement  237 mL Oral BID BM   insulin aspart  0-5 Units Subcutaneous QHS   insulin aspart  0-9 Units Subcutaneous TID WC   pantoprazole  40 mg Oral BID   Continuous Infusions:  sodium chloride     dextrose 5 % and 0.45 % NaCl with KCl 20 mEq/L 50 mL/hr at 08/07/21 1800     LOS: 4 days    Time spent: over 30 min    Fayrene Helper, MD [facility]   To contact the attending provider between 7A-7P or the covering provider during after hours 7P-7A, please log into the web site [email or web address] and access using universal [place] password for that web site. If you do not have the password, please call the hospital operator.  08/07/2021, 6:39 PM

## 2021-08-07 NOTE — Progress Notes (Signed)
Inpatient Diabetes Program Recommendations  AACE/ADA: New Consensus Statement on Inpatient Glycemic Control   Target Ranges:  Prepandial:   less than 140 mg/dL      Peak postprandial:   less than 180 mg/dL (1-2 hours)      Critically ill patients:  140 - 180 mg/dL    Latest Reference Range & Units 08/07/21 07:46 08/07/21 11:45  Glucose-Capillary 70 - 99 mg/dL 140 (H) 233 (H)  (H): Data is abnormally high  Latest Reference Range & Units 08/06/21 07:50  Hemoglobin A1C 4.8 - 5.6 % 7.5 (H)  (H): Data is abnormally high Review of Glycemic Control  Diabetes history: No Outpatient Diabetes medications: NA Current orders for Inpatient glycemic control: Novolog 0-9 units TID with meals, Novolog 0-5 units QHS  Inpatient Diabetes Program Recommendations:    HbgA1C:  A1C 7.5% on 08/06/21 indicating an average glucose of 169 mg/dl over the past 2-3 months. Initial hemoglobin low so A1C result could be inaccurate due to low hemoglobin. If patient is being newly dx with DM this admission, please inform patient and nursing staff so patient can be educated on DM.  Thanks, Barnie Alderman, RN, MSN, CDE Diabetes Coordinator Inpatient Diabetes Program [phone] (Team Pager from 8am to 5pm)

## 2021-08-07 NOTE — Progress Notes (Signed)
[facility] Progress Note  2 Days Post-Op  Subjective: CC:  Tolerating FLD. No further emesis other than one episode yesterday AM. Pain controlled. Ongoing flatus, no further BMs, just one BM post-op. Mobilizing in the hallway and voiding without difficulty. Path pending.  Objective: Vital signs in last 24 hours: Temp:  [98.3 F (36.8 C)-98.9 F (37.2 C)] 98.3 F (36.8 C) (11/18 0518) Pulse Rate:  [71-81] 71 (11/18 0518) Resp:  [16-18] 16 (11/18 0518) BP: (158-165)/(71-75) 158/73 (11/18 0518) SpO2:  [95 %-99 %] 95 % (11/18 0518) Weight:  [82.5 kg] 82.5 kg (11/18 0518) Last BM Date: 08/05/21  Intake/Output from previous day: 11/17 0701 - 11/18 0700 In: 160 [P.O.:120; I.V.:40] Out: 0  Intake/Output this shift: No intake/output data recorded.  PE: Gen:  Alert, NAD, pleasant Card:  Regular rate and rhythm, pedal pulses 2+ BL Abd: Soft, appropriately tender, midline incisions with small amt dried blood on dressing, +BS Skin: warm and dry, no rashes  Psych: A&Ox3   Lab Results:  Recent Labs    08/06/21 0415 08/07/21 0401  WBC 13.0* 11.4*  HGB 10.6* 10.2*  HCT 33.2* 33.3*  PLT 320 326   BMET Recent Labs    08/06/21 0415 08/07/21 0401  NA 133* 138  K 3.7 3.8  CL 104 106  CO2 22 25  GLUCOSE 258* 194*  BUN 7* 9  CREATININE 0.60 0.64  CALCIUM 8.8* 9.0   PT/INR No results for input(s): LABPROT, INR in the last 72 hours. CMP     Component Value Date/Time   NA 138 08/07/2021 0401   K 3.8 08/07/2021 0401   CL 106 08/07/2021 0401   CO2 25 08/07/2021 0401   GLUCOSE 194 (H) 08/07/2021 0401   BUN 9 08/07/2021 0401   CREATININE 0.64 08/07/2021 0401   CALCIUM 9.0 08/07/2021 0401   PROT 7.4 08/01/2021 1337   ALBUMIN 3.6 08/01/2021 1337   AST 17 08/01/2021 1337   ALT 12 08/01/2021 1337   ALKPHOS 76 08/01/2021 1337   BILITOT 0.5 08/01/2021 1337   GFRNONAA >60 08/07/2021 0401   GFRAA  10/05/2007 2100    >60        The eGFR has been  calculated using the MDRD equation. This calculation has not been validated in all clinical   Lipase     Component Value Date/Time   LIPASE 32 08/01/2021 1337       Studies/Results: No results found.  Anti-infectives: Anti-infectives (From admission, onward)    Start     Dose/Rate Route Frequency Ordered Stop   08/05/21 1045  ertapenem (INVANZ) 1,000 mg in sodium chloride 0.9 % 100 mL IVPB        1 g 200 mL/hr over 30 Minutes Intravenous On call to O.R. 08/04/21 1046 08/05/21 1220   08/05/21 0800  metroNIDAZOLE (FLAGYL) tablet 1,000 mg        1,000 mg Oral Once 08/05/21 0714 08/05/21 0751   08/05/21 0800  erythromycin (E-MYCIN) tablet 1,000 mg        1,000 mg Oral Once 08/05/21 0714 08/05/21 0751   08/04/21 1400  neomycin (MYCIFRADIN) tablet 1,000 mg  Status:  Discontinued       See Hyperspace for full Linked Orders Report.   1,000 mg Oral 3 times per day 08/05/21 0713 08/05/21 0714   08/04/21 1400  metroNIDAZOLE (FLAGYL) tablet 1,000 mg  Status:  Discontinued       See Hyperspace for full Linked Orders Report.   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 Paulita Fujita POD#2 s/p open right colectomy 08/05/21 Dr. Armandina Gemma - afebrile, VSS, H&H stable  - having flatus this morning, no further BMs just one small BM after surgery on 11/16 - continue FLD, appears entereg was stopped yesterday bc pt had a BM. - Mobilize/OOB - PRN analgesics and antiemetics - surgical pathology pending   FEN: FLD ID: ertapenem 11/16 periop VTE: SCD's, Lovenox Foley: none, voiding independently  Dispo: med-surg     LOS: 4 days    Obie Dredge, [facility] Please see Amion for pager number during day hours 7:00am-4:30pm

## 2021-08-07 NOTE — Care Management Important Message (Deleted)
Important Message  Patient Details IM Letter given to the Patient. Name: [name] MRN: [id number] Date of Birth: [birth year]   Medicare Important Message Given:  Yes     Kerin Salen 08/07/2021, 10:56 AM

## 2021-08-08 LAB — CBC WITH DIFFERENTIAL/PLATELET
Abs Immature Granulocytes: 0.04 10*3/uL (ref 0.00–0.07)
Basophils Absolute: 0 10*3/uL (ref 0.0–0.1)
Basophils Relative: 0 %
Eosinophils Absolute: 0.1 10*3/uL (ref 0.0–0.5)
Eosinophils Relative: 1 %
HCT: 32.8 % — ABNORMAL LOW (ref 36.0–46.0)
Hemoglobin: 10 g/dL — ABNORMAL LOW (ref 12.0–15.0)
Immature Granulocytes: 1 %
Lymphocytes Relative: 31 %
Lymphs Abs: 2.3 10*3/uL (ref 0.7–4.0)
MCH: 23.9 pg — ABNORMAL LOW (ref 26.0–34.0)
MCHC: 30.5 g/dL (ref 30.0–36.0)
MCV: 78.3 fL — ABNORMAL LOW (ref 80.0–100.0)
Monocytes Absolute: 0.6 10*3/uL (ref 0.1–1.0)
Monocytes Relative: 8 %
Neutro Abs: 4.3 10*3/uL (ref 1.7–7.7)
Neutrophils Relative %: 59 %
Platelets: 323 10*3/uL (ref 150–400)
RBC: 4.19 MIL/uL (ref 3.87–5.11)
RDW: 20 % — ABNORMAL HIGH (ref 11.5–15.5)
WBC: 7.3 10*3/uL (ref 4.0–10.5)
nRBC: 0 % (ref 0.0–0.2)

## 2021-08-08 LAB — PHOSPHORUS: Phosphorus: 2.3 mg/dL — ABNORMAL LOW (ref 2.5–4.6)

## 2021-08-08 LAB — COMPREHENSIVE METABOLIC PANEL
ALT: 14 U/L (ref 0–44)
AST: 18 U/L (ref 15–41)
Albumin: 3 g/dL — ABNORMAL LOW (ref 3.5–5.0)
Alkaline Phosphatase: 62 U/L (ref 38–126)
Anion gap: 6 (ref 5–15)
BUN: 9 mg/dL (ref 8–23)
CO2: 27 mmol/L (ref 22–32)
Calcium: 8.8 mg/dL — ABNORMAL LOW (ref 8.9–10.3)
Chloride: 104 mmol/L (ref 98–111)
Creatinine, Ser: 0.73 mg/dL (ref 0.44–1.00)
GFR, Estimated: >60 mL/min (ref >60)
Glucose, Bld: 177 mg/dL — ABNORMAL HIGH (ref 70–99)
Potassium: 3.7 mmol/L (ref 3.5–5.1)
Sodium: 137 mmol/L (ref 135–145)
Total Bilirubin: 0.6 mg/dL (ref 0.3–1.2)
Total Protein: 7 g/dL (ref 6.5–8.1)

## 2021-08-08 LAB — GLUCOSE, CAPILLARY
Glucose-Capillary: 177 mg/dL — ABNORMAL HIGH (ref 70–99)
Glucose-Capillary: 215 mg/dL — ABNORMAL HIGH (ref 70–99)
Glucose-Capillary: 136 mg/dL — ABNORMAL HIGH (ref 70–99)
Glucose-Capillary: 172 mg/dL — ABNORMAL HIGH (ref 70–99)

## 2021-08-08 LAB — MAGNESIUM: Magnesium: 2 mg/dL (ref 1.7–2.4)

## 2021-08-08 NOTE — Progress Notes (Signed)
PROGRESS NOTE    [name]  YDX:[id number] DOB: [birth year] DOA: 08/01/2021 PCP: Anita Huh, NP    Chief Complaint  Patient presents with   Abdominal Pain   Fall    Brief Narrative:  [name] is [name] 80 yo female with PMH afib, pseudophakia both eyes, hemorrhoids who presented to the ER with mild abdominal discomfort for at least the past 3 weeks.  She did have 1 episode of bloody stools just prior to admission.     Patient reports 7-month history of right lower quadrant abdominal pain and generalized weakness in the morning after she wakes up.  Also episodes of nausea but no vomiting.  Symptoms usually improve after she eats something.  Also had [name] few episodes of dark stools.  Reports lightheadedness in the morning and also dyspnea with exertion.  Patient states she had bloody stools 3 months ago which she thought were due to her eating [name] lot of popcorn.  Her PCP recommended colonoscopy but it has not been done yet.   She thinks she had [name] colonoscopy several years ago but cannot recall when or findings.    CT abdomen/pelvis was performed on admission which showed no acute findings. Hemoglobin was noted to be 7.4 g/dL.  No recent labs in epic for comparison.  Last noted labs are from 2011 at which time hemoglobin was 11 to 12 g/dL. She was transfused 1 unit PRBC on admission.   She underwent colonoscopy and EGD on 08/03/2021.  Colonoscopy was notable for an infiltrative and nonobstructing cecal mass.  General surgery was then consulted for further evaluation.  Patient was planned for undergoing right hemicolectomy on 08/05/2021. 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[place]) Active Problems:   Symptomatic anemia   GI bleed   Abnormal urinalysis   Type 2 diabetes mellitus without complication (HCC)  * Adenocarcinoma of cecum (HCC) Initially presented with  symptomatic anemia and RLQ pain On colonoscopy, noted to have infiltrative, ulcerated, nonobstructing cecal mass on 11/14 Path with invasive moderately differentiated adenocarcinoma Now s/p open right colectomy 11/16 Follow surgical path -> invasive colorectal adenocarcinoma, tubular adenoma, unremarkable proximal small intestinal and distal colonic resection margins  CT chest without contrast without evidence of metastatic disease CT abd pelvis without contrast without acute findings  Appreciate surgery assistance, advancing to soft diet Discussed with Dr. Lorenso Riddle 11/18 from oncology, he noted he'll arrange outpatient oncology follow up   Symptomatic anemia Hb on admit 7.4 S/p 2 units prbc Trend and transfuse as needed 2/2 cecal mass as noted above Continue PPI  GI bleed 2/2 adenocarcinoma of cecum  Abnormal urinalysis Workup further if symptomatic  Type 2 diabetes mellitus without complication (HCC) H2C 7.5 She'll probably need to discharge on [name] med, consider metformin at discharge (though can cause some GI symptoms)   DVT prophylaxis: lovenox Code Status: full Family Communication: daughters at bedside Disposition:   Status is: Inpatient  Remains inpatient appropriate because: continued need for post op care       Consultants:  Surgery GI  Procedures:  open right colectomy 11/16 Colonoscopy, EGD 11/14  Antimicrobials:  Anti-infectives (From admission, onward)    Start     Dose/Rate Route Frequency Ordered Stop   08/05/21 1045  ertapenem (INVANZ) 1,000 mg in sodium chloride 0.9 % 100 mL IVPB        1 g 200 mL/hr over 30 Minutes Intravenous On call  to O.R. 08/04/21 1046 08/05/21 1220   08/05/21 0800  metroNIDAZOLE (FLAGYL) tablet 1,000 mg        1,000 mg Oral Once 08/05/21 0714 08/05/21 0751   08/05/21 0800  erythromycin (E-MYCIN) tablet 1,000 mg        1,000 mg Oral Once 08/05/21 0714 08/05/21 0751   08/04/21 1400  neomycin (MYCIFRADIN) tablet 1,000 mg   Status:  Discontinued       See Hyperspace for full Linked Orders Report.   1,000 mg Oral 3 times per day 08/05/21 0713 08/05/21 0714   08/04/21 1400  metroNIDAZOLE (FLAGYL) tablet 1,000 mg  Status:  Discontinued       See Hyperspace for full Linked Orders Report.   1,000 mg Oral 3 times per day 08/05/21 0713 08/05/21 0714          Subjective: No complaints today  Objective: Vitals:   08/07/21 2118 08/08/21 0517 08/08/21 0819 08/08/21 1325  BP: (!) 142/66 (!) 147/70 123/61 136/63  Pulse: 71 61 61 66  Resp: 16 16 16 14   Temp: 98.6 F (37 C) 98 F (36.7 C)  97.6 F (36.4 C)  TempSrc: Oral Oral  Oral  SpO2: 97% 99% 98% 100%  Weight:  83.2 kg    Height:        Intake/Output Summary (Last 24 hours) at 08/08/2021 1614 Last data filed at 08/08/2021 1300 Gross per 24 hour  Intake 1903.33 ml  Output --  Net 1903.33 ml   Filed Weights   08/05/21 1017 08/07/21 0518 08/08/21 0517  Weight: 85.7 kg 82.5 kg 83.2 kg    Examination:  General: No acute distress. Cardiovascular: RRR Lungs: unlabored Abdomen: intact dressing, appropriately tender Neurological: Alert and oriented 3. Moves all extremities 4 with equal strength. Cranial nerves II through XII grossly intact. Skin: Warm and dry. No rashes or lesions. Extremities: No clubbing or cyanosis. No edema.  Data Reviewed: I have personally reviewed following labs and imaging studies  CBC: Recent Labs  Lab 08/04/21 0411 08/05/21 0425 08/06/21 0415 08/07/21 0401 08/08/21 0411  WBC 4.5 4.6 13.0* 11.4* 7.3  NEUTROABS  --   --   --   --  4.3  HGB 8.1* 9.7* 10.6* 10.2* 10.0*  HCT 26.5* 30.6* 33.2* 33.3* 32.8*  MCV 75.7* 76.7* 76.0* 76.6* 78.3*  PLT 328 332 320 326 025    Basic Metabolic Panel: Recent Labs  Lab 08/04/21 0411 08/05/21 0425 08/06/21 0415 08/07/21 0401 08/08/21 0411  NA 140 140 133* 138 137  K 3.9 3.5 3.7 3.8 3.7  CL 109 109 104 106 104  CO2 24 25 22 25 27   GLUCOSE 137* 121* 258* 194* 177*   BUN 6* 6* 7* 9 9  CREATININE 0.62 0.70 0.60 0.64 0.73  CALCIUM 8.9 8.9 8.8* 9.0 8.8*  MG 2.2 1.9 2.0 2.2 2.0  PHOS  --   --   --   --  2.3*    GFR: Estimated Creatinine Clearance: 62 mL/min (by C-G formula based on SCr of 0.73 mg/dL).  Liver Function Tests: Recent Labs  Lab 08/08/21 0411  AST 18  ALT 14  ALKPHOS 62  BILITOT 0.6  PROT 7.0  ALBUMIN 3.0*    CBG: Recent Labs  Lab 08/07/21 1145 08/07/21 1650 08/07/21 2121 08/08/21 0721 08/08/21 1157  GLUCAP 233* 142* 171* 177* 215*     Recent Results (from the past 240 hour(s))  Urine Culture     Status: None   Collection Time: 08/01/21  6:24 PM   Specimen: Urine, Clean Catch  Result Value Ref Range Status   Specimen Description   Final    URINE, CLEAN CATCH Performed at [facility], [place] [address], [place], [place] [address]    Special Requests   Final    NONE Performed at [facility], [place] [address] [place], [place] [address]    Culture   Final    NO GROWTH Performed at [facility], [place] [address] [place], [place] [address]    Report Status 08/03/2021 FINAL  Final  Resp Panel by RT-PCR (Flu Lavern Maslow&B, Covid) Nasopharyngeal Swab     Status: None   Collection Time: 08/01/21  7:00 PM   Specimen: Nasopharyngeal Swab; Nasopharyngeal(NP) swabs in vial transport medium  Result Value Ref Range Status   SARS Coronavirus 2 by RT PCR NEGATIVE NEGATIVE Final    Comment: (NOTE) SARS-CoV-2 target nucleic acids are NOT DETECTED.  The SARS-CoV-2 RNA is generally detectable in upper respiratory specimens during the acute phase of infection. The lowest concentration of SARS-CoV-2 viral copies this assay can detect is 138 copies/mL. Brandye Inthavong negative result does not preclude SARS-Cov-2 infection and should not be used as the sole basis for treatment or other patient management decisions. Korban Shearer negative result may occur with  improper specimen collection/handling, submission of specimen  other than nasopharyngeal swab, presence of viral mutation(s) within the areas targeted by this assay, and inadequate number of viral copies(<138 copies/mL). Christin Mccreedy negative result must be combined with clinical observations, patient history, and epidemiological information. The expected result is Negative.  Fact Sheet for Patients:  EntrepreneurPulse.com.au  Fact Sheet for Healthcare Providers:  IncredibleEmployment.be  This test is no t yet approved or cleared by the [place] FDA and  has been authorized for detection and/or diagnosis of SARS-CoV-2 by FDA under an Emergency Use Authorization (EUA). This EUA will remain  in effect (meaning this test can be used) for the duration of the COVID-19 declaration under Section 564(b)(1) of the Act, 21 U.S.C.section 360bbb-3(b)(1), unless the authorization is terminated  or revoked sooner.       Influenza Mikaia Janvier by PCR NEGATIVE NEGATIVE Final   Influenza B by PCR NEGATIVE NEGATIVE Final    Comment: (NOTE) The Xpert Xpress SARS-CoV-2/FLU/RSV plus assay is intended as an aid in the diagnosis of influenza from Nasopharyngeal swab specimens and should not be used as Mervil Wacker sole basis for treatment. Nasal washings and aspirates are unacceptable for Xpert Xpress SARS-CoV-2/FLU/RSV testing.  Fact Sheet for Patients: EntrepreneurPulse.com.au  Fact Sheet for Healthcare Providers: IncredibleEmployment.be  This test is not yet approved or cleared by the [place] FDA and has been authorized for detection and/or diagnosis of SARS-CoV-2 by FDA under an Emergency Use Authorization (EUA). This EUA will remain in effect (meaning this test can be used) for the duration of the COVID-19 declaration under Section 564(b)(1) of the Act, 21 U.S.C. section 360bbb-3(b)(1), unless the authorization is terminated or revoked.  Performed at [facility], [place] [address] [place],  [address]          Radiology Studies: No results found.      Scheduled Meds:  acetaminophen (TYLENOL) oral liquid 160 mg/5 mL  1,000 mg Oral Q8H   enoxaparin (LOVENOX) injection  40 mg Subcutaneous Q24H   feeding supplement  237 mL Oral BID BM   insulin aspart  0-5 Units Subcutaneous QHS   insulin aspart  0-9 Units Subcutaneous TID WC  pantoprazole  40 mg Oral BID   Continuous Infusions:  sodium chloride     dextrose 5 % and 0.45 % NaCl with KCl 20 mEq/L 50 mL/hr at 08/07/21 1800     LOS: 5 days    Time spent: over 30 min    Fayrene Helper, MD [facility]   To contact the attending provider between 7A-7P or the covering provider during after hours 7P-7A, please log into the web site [email or web address] and access using universal [place] password for that web site. If you do not have the password, please call the hospital operator.  08/08/2021, 4:14 PM

## 2021-08-08 NOTE — Progress Notes (Signed)
[facility] Progress Note  3 Days Post-Op  Subjective: Tolerating FLD without emesis. Pain well controlled. Passing flatus and BM yesterday. Ambulating in hallway  Objective: Vital signs in last 24 hours: Temp:  [97.7 F (36.5 C)-98.6 F (37 C)] 98 F (36.7 C) (11/19 0517) Pulse Rate:  [61-76] 61 (11/19 0819) Resp:  [14-16] 16 (11/19 0819) BP: (123-150)/(61-70) 123/61 (11/19 0819) SpO2:  [97 %-99 %] 98 % (11/19 0819) Weight:  [83.2 kg] 83.2 kg (11/19 0517) Last BM Date: 08/06/21  Intake/Output from previous day: 11/18 0701 - 11/19 0700 In: 1560.1 [P.O.:960; I.V.:600.1] Out: -  Intake/Output this shift: Total I/O In: 120 [P.O.:120] Out: -   PE: Gen:  Alert, NAD, pleasant Card:  Regular rate and rhythm Abd: Soft, appropriately tender without rigidity, midline incisions with dressing c/d/i, +BS Skin: warm and dry, no rashes  Psych: A&Ox3   Lab Results:  Recent Labs    08/07/21 0401 08/08/21 0411  WBC 11.4* 7.3  HGB 10.2* 10.0*  HCT 33.3* 32.8*  PLT 326 323    BMET Recent Labs    08/07/21 0401 08/08/21 0411  NA 138 137  K 3.8 3.7  CL 106 104  CO2 25 27  GLUCOSE 194* 177*  BUN 9 9  CREATININE 0.64 0.73  CALCIUM 9.0 8.8*    PT/INR No results for input(s): LABPROT, INR in the last 72 hours. CMP     Component Value Date/Time   NA 137 08/08/2021 0411   K 3.7 08/08/2021 0411   CL 104 08/08/2021 0411   CO2 27 08/08/2021 0411   GLUCOSE 177 (H) 08/08/2021 0411   BUN 9 08/08/2021 0411   CREATININE 0.73 08/08/2021 0411   CALCIUM 8.8 (L) 08/08/2021 0411   PROT 7.0 08/08/2021 0411   ALBUMIN 3.0 (L) 08/08/2021 0411   AST 18 08/08/2021 0411   ALT 14 08/08/2021 0411   ALKPHOS 62 08/08/2021 0411   BILITOT 0.6 08/08/2021 0411   GFRNONAA >60 08/08/2021 0411   GFRAA  10/05/2007 2100    >60        The eGFR has been calculated using the MDRD equation. This calculation has not been validated in all clinical   Lipase     Component Value  Date/Time   LIPASE 32 08/01/2021 1337       Studies/Results: No results found.  Anti-infectives: Anti-infectives (From admission, onward)    Start     Dose/Rate Route Frequency Ordered Stop   08/05/21 1045  ertapenem (INVANZ) 1,000 mg in sodium chloride 0.9 % 100 mL IVPB        1 g 200 mL/hr over 30 Minutes Intravenous On call to O.R. 08/04/21 1046 08/05/21 1220   08/05/21 0800  metroNIDAZOLE (FLAGYL) tablet 1,000 mg        1,000 mg Oral Once 08/05/21 0714 08/05/21 0751   08/05/21 0800  erythromycin (E-MYCIN) tablet 1,000 mg        1,000 mg Oral Once 08/05/21 0714 08/05/21 0751   08/04/21 1400  neomycin (MYCIFRADIN) tablet 1,000 mg  Status:  Discontinued       See Hyperspace for full Linked Orders Report.   1,000 mg Oral 3 times per day 08/05/21 0713 08/05/21 0714   08/04/21 1400  metroNIDAZOLE (FLAGYL) tablet 1,000 mg  Status:  Discontinued       See Hyperspace for full Linked Orders Report.   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 Paulita Fujita POD#3 s/p open right colectomy 08/05/21 Dr. Armandina Gemma - WBC normal, afebrile, VSS, H&H stable  - +flatus and +BM - tolerating FLD - advance to soft today - Mobilize/OOB - PRN analgesics and antiemetics - surgical pathology with invasive colorectal adenocarcinoma - primary has discussed with Dr. Lorenso Courier med/onc and she will follow up outpatient  FEN: soft ID: ertapenem 11/16 periop VTE: SCD's, Lovenox Foley: none, voiding independently  Dispo: med-surg    LOS: 5 days    Obie Dredge, [facility] Surgery Please see Amion for pager number during day hours 7:00am-4:30pm

## 2021-08-09 LAB — CBC WITH DIFFERENTIAL/PLATELET
Abs Immature Granulocytes: 0.01 10*3/uL (ref 0.00–0.07)
Basophils Absolute: 0 10*3/uL (ref 0.0–0.1)
Basophils Relative: 0 %
Eosinophils Absolute: 0.2 10*3/uL (ref 0.0–0.5)
Eosinophils Relative: 3 %
HCT: 31.3 % — ABNORMAL LOW (ref 36.0–46.0)
Hemoglobin: 9.5 g/dL — ABNORMAL LOW (ref 12.0–15.0)
Immature Granulocytes: 0 %
Lymphocytes Relative: 36 %
Lymphs Abs: 2.1 10*3/uL (ref 0.7–4.0)
MCH: 23.8 pg — ABNORMAL LOW (ref 26.0–34.0)
MCHC: 30.4 g/dL (ref 30.0–36.0)
MCV: 78.4 fL — ABNORMAL LOW (ref 80.0–100.0)
Monocytes Absolute: 0.4 10*3/uL (ref 0.1–1.0)
Monocytes Relative: 7 %
Neutro Abs: 3 10*3/uL (ref 1.7–7.7)
Neutrophils Relative %: 54 %
Platelets: 311 10*3/uL (ref 150–400)
RBC: 3.99 MIL/uL (ref 3.87–5.11)
RDW: 19.9 % — ABNORMAL HIGH (ref 11.5–15.5)
WBC: 5.7 10*3/uL (ref 4.0–10.5)
nRBC: 0 % (ref 0.0–0.2)

## 2021-08-09 LAB — COMPREHENSIVE METABOLIC PANEL
ALT: 11 U/L (ref 0–44)
AST: 14 U/L — ABNORMAL LOW (ref 15–41)
Albumin: 2.8 g/dL — ABNORMAL LOW (ref 3.5–5.0)
Alkaline Phosphatase: 61 U/L (ref 38–126)
Anion gap: 7 (ref 5–15)
BUN: 12 mg/dL (ref 8–23)
CO2: 25 mmol/L (ref 22–32)
Calcium: 9 mg/dL (ref 8.9–10.3)
Chloride: 104 mmol/L (ref 98–111)
Creatinine, Ser: 0.66 mg/dL (ref 0.44–1.00)
GFR, Estimated: >60 mL/min (ref >60)
Glucose, Bld: 207 mg/dL — ABNORMAL HIGH (ref 70–99)
Potassium: 3.6 mmol/L (ref 3.5–5.1)
Sodium: 136 mmol/L (ref 135–145)
Total Bilirubin: 0.4 mg/dL (ref 0.3–1.2)
Total Protein: 6.6 g/dL (ref 6.5–8.1)

## 2021-08-09 LAB — GLUCOSE, CAPILLARY
Glucose-Capillary: 181 mg/dL — ABNORMAL HIGH (ref 70–99)
Glucose-Capillary: 230 mg/dL — ABNORMAL HIGH (ref 70–99)
Glucose-Capillary: 121 mg/dL — ABNORMAL HIGH (ref 70–99)
Glucose-Capillary: 180 mg/dL — ABNORMAL HIGH (ref 70–99)

## 2021-08-09 LAB — PHOSPHORUS: Phosphorus: 3.8 mg/dL (ref 2.5–4.6)

## 2021-08-09 LAB — MAGNESIUM: Magnesium: 1.7 mg/dL (ref 1.7–2.4)

## 2021-08-09 MED ORDER — DOCUSATE SODIUM 100 MG PO CAPS: 100.0000 mg | ORAL_CAPSULE | Freq: Two times a day (BID) | ORAL | Status: AC | PRN

## 2021-08-09 MED ORDER — METFORMIN HCL 500 MG PO TABS
500.0000 mg | ORAL_TABLET | Freq: Every day | ORAL | Status: DC
  Administered 2021-08-10: 500 mg via ORAL
  Filled 2021-08-09: qty 1

## 2021-08-09 MED ORDER — ACETAMINOPHEN 160 MG/5ML PO SOLN: 1000.0000 mg | Freq: Three times a day (TID) | ORAL | Status: AC

## 2021-08-09 MED ORDER — TRAMADOL HCL 50 MG PO TABS: 50.0000 mg | ORAL_TABLET | Freq: Four times a day (QID) | ORAL | 0 refills | Status: AC | PRN

## 2021-08-09 NOTE — Progress Notes (Addendum)
[facility] Progress Note  4 Days Post-Op  Subjective: Tolerating soft diet. Pain controlled and having bowel movements. No nausea or emesis. Ambulating. Daughters are bedside  Objective: Vital signs in last 24 hours: Temp:  [97.6 F (36.4 C)-98.5 F (36.9 C)] 98.3 F (36.8 C) (11/20 0543) Pulse Rate:  [66-70] 70 (11/20 0543) Resp:  [14-16] 16 (11/20 0543) BP: (119-153)/(63-69) 119/69 (11/20 0543) SpO2:  [98 %-100 %] 98 % (11/20 0543) Weight:  [83.7 kg] 83.7 kg (11/20 0543) Last BM Date: 08/08/21  Intake/Output from previous day: 11/19 0701 - 11/20 0700 In: 1303.2 [P.O.:960; I.V.:343.2] Out: -  Intake/Output this shift: No intake/output data recorded.  PE: Gen:  Alert, NAD, pleasant Card:  Regular rate and rhythm Abd: Soft, appropriately tender without rigidity, midline incision with dressing c/d/I and staples visible intact through dressing, +BS Skin: warm and dry, no rashes  Psych: A&Ox3   Lab Results:  Recent Labs    08/08/21 0411 08/09/21 0418  WBC 7.3 5.7  HGB 10.0* 9.5*  HCT 32.8* 31.3*  PLT 323 311    BMET Recent Labs    08/08/21 0411 08/09/21 0418  NA 137 136  K 3.7 3.6  CL 104 104  CO2 27 25  GLUCOSE 177* 207*  BUN 9 12  CREATININE 0.73 0.66  CALCIUM 8.8* 9.0    PT/INR No results for input(s): LABPROT, INR in the last 72 hours. CMP     Component Value Date/Time   NA 136 08/09/2021 0418   K 3.6 08/09/2021 0418   CL 104 08/09/2021 0418   CO2 25 08/09/2021 0418   GLUCOSE 207 (H) 08/09/2021 0418   BUN 12 08/09/2021 0418   CREATININE 0.66 08/09/2021 0418   CALCIUM 9.0 08/09/2021 0418   PROT 6.6 08/09/2021 0418   ALBUMIN 2.8 (L) 08/09/2021 0418   AST 14 (L) 08/09/2021 0418   ALT 11 08/09/2021 0418   ALKPHOS 61 08/09/2021 0418   BILITOT 0.4 08/09/2021 0418   GFRNONAA >60 08/09/2021 0418   GFRAA  10/05/2007 2100    >60        The eGFR has been calculated using the MDRD equation. This calculation has not been validated  in all clinical   Lipase     Component Value Date/Time   LIPASE 32 08/01/2021 1337       Studies/Results: No results found.  Anti-infectives: Anti-infectives (From admission, onward)    Start     Dose/Rate Route Frequency Ordered Stop   08/05/21 1045  ertapenem (INVANZ) 1,000 mg in sodium chloride 0.9 % 100 mL IVPB        1 g 200 mL/hr over 30 Minutes Intravenous On call to O.R. 08/04/21 1046 08/05/21 1220   08/05/21 0800  metroNIDAZOLE (FLAGYL) tablet 1,000 mg        1,000 mg Oral Once 08/05/21 0714 08/05/21 0751   08/05/21 0800  erythromycin (E-MYCIN) tablet 1,000 mg        1,000 mg Oral Once 08/05/21 0714 08/05/21 0751   08/04/21 1400  neomycin (MYCIFRADIN) tablet 1,000 mg  Status:  Discontinued       See Hyperspace for full Linked Orders Report.   1,000 mg Oral 3 times per day 08/05/21 0713 08/05/21 0714   08/04/21 1400  metroNIDAZOLE (FLAGYL) tablet 1,000 mg  Status:  Discontinued       See Hyperspace for full Linked Orders Report.   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 Paulita Fujita POD#4 s/p open right colectomy 08/05/21 Dr. Armandina Gemma - WBC normal, afebrile, VSS, Hgb 9.5 (10.0) - +flatus and +BM - tolerating soft diet  - Mobilize/OOB - PRN analgesics and antiemetics - surgical pathology with invasive colorectal adenocarcinoma - primary has discussed with Dr. Lorenso Courier med/onc and she will follow up outpatient  FEN: soft ID: ertapenem 11/16 periop VTE: SCD's, Lovenox Foley: none, voiding independently     LOS: 6 days   Winferd Humphrey, [facility] Surgery 08/09/2021, 9:29 AM Please see Amion for pager number during day hours 7:00am-4:30pm

## 2021-08-09 NOTE — Progress Notes (Signed)
PROGRESS NOTE    [name]  KNL:[id number] DOB: [birth year] DOA: 08/01/2021 PCP: Anita Huh, NP    Chief Complaint  Patient presents with   Abdominal Pain   Fall    Brief Narrative:  [name] is [name] 80 yo female with PMH afib, pseudophakia both eyes, hemorrhoids who presented to the ER with mild abdominal discomfort for at least the past 3 weeks.  She did have 1 episode of bloody stools just prior to admission.     Patient reports 44-month history of right lower quadrant abdominal pain and generalized weakness in the morning after she wakes up.  Also episodes of nausea but no vomiting.  Symptoms usually improve after she eats something.  Also had [name] few episodes of dark stools.  Reports lightheadedness in the morning and also dyspnea with exertion.  Patient states she had bloody stools 3 months ago which she thought were due to her eating [name] lot of popcorn.  Her PCP recommended colonoscopy but it has not been done yet.   She thinks she had [name] colonoscopy several years ago but cannot recall when or findings.    CT abdomen/pelvis was performed on admission which showed no acute findings. Hemoglobin was noted to be 7.4 g/dL.  No recent labs in epic for comparison.  Last noted labs are from 2011 at which time hemoglobin was 11 to 12 g/dL. She was transfused 1 unit PRBC on admission.   She underwent colonoscopy and EGD on 08/03/2021.  Colonoscopy was notable for an infiltrative and nonobstructing cecal mass.  General surgery was then consulted for further evaluation.  Patient was planned for undergoing right hemicolectomy on 08/05/2021. 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[place]) Active Problems:   Symptomatic anemia   GI bleed   Abnormal urinalysis   Type 2 diabetes mellitus without complication (HCC)  * Adenocarcinoma of cecum (HCC) Initially presented with  symptomatic anemia and RLQ pain On colonoscopy, noted to have infiltrative, ulcerated, nonobstructing cecal mass on 11/14 Path with invasive moderately differentiated adenocarcinoma Now s/p open right colectomy 11/16 Follow surgical path -> invasive colorectal adenocarcinoma, tubular adenoma, unremarkable proximal small intestinal and distal colonic resection margins  CT chest without contrast without evidence of metastatic disease CT abd pelvis without contrast without acute findings  Appreciate surgery assistance, advancing to soft diet Discussed with [name] 11/18 from oncology, he noted he'll arrange outpatient oncology follow up   Symptomatic anemia Hb on admit 7.4 S/p 2 units prbc Trend and transfuse as needed 2/2 cecal mass as noted above Continue PPI  GI bleed 2/2 adenocarcinoma of cecum  Abnormal urinalysis Workup further if symptomatic  Type 2 diabetes mellitus without complication (HCC) X9K 7.5 Start metformin, follow outpatient   DVT prophylaxis: lovenox Code Status: full Family Communication: niece at bedside Disposition:   Status is: Inpatient  Remains inpatient appropriate because: continued need for post op care       Consultants:  Surgery GI  Procedures:  open right colectomy 11/16 Colonoscopy, EGD 11/14  Antimicrobials:  Anti-infectives (From admission, onward)    Start     Dose/Rate Route Frequency Ordered Stop   08/05/21 1045  ertapenem (INVANZ) 1,000 mg in sodium chloride 0.9 % 100 mL IVPB        1 g 200 mL/hr over 30 Minutes Intravenous On call to O.R. 08/04/21 1046 08/05/21 1220   08/05/21 0800  metroNIDAZOLE (FLAGYL) tablet  1,000 mg        1,000 mg Oral Once 08/05/21 0714 08/05/21 0751   08/05/21 0800  erythromycin (E-MYCIN) tablet 1,000 mg        1,000 mg Oral Once 08/05/21 0714 08/05/21 0751   08/04/21 1400  neomycin (MYCIFRADIN) tablet 1,000 mg  Status:  Discontinued       See Hyperspace for full Linked Orders Report.    1,000 mg Oral 3 times per day 08/05/21 0713 08/05/21 0714   08/04/21 1400  metroNIDAZOLE (FLAGYL) tablet 1,000 mg  Status:  Discontinued       See Hyperspace for full Linked Orders Report.   1,000 mg Oral 3 times per day 08/05/21 0713 08/05/21 0714          Subjective: No complaints  Objective: Vitals:   08/08/21 2134 08/09/21 0543 08/09/21 1356 08/09/21 1407  BP: (!) 153/64 119/69 (!) 130/58 136/78  Pulse: 69 70 61 (!) 104  Resp: 16 16 15 15   Temp: 98.5 F (36.9 C) 98.3 F (36.8 C) 98.1 F (36.7 C) 97.6 F (36.4 C)  TempSrc: Oral Oral Oral Oral  SpO2: 98% 98% 100% 98%  Weight:  83.7 kg    Height:        Intake/Output Summary (Last 24 hours) at 08/09/2021 1503 Last data filed at 08/09/2021 1300 Gross per 24 hour  Intake 1093.48 ml  Output --  Net 1093.48 ml   Filed Weights   08/07/21 0518 08/08/21 0517 08/09/21 0543  Weight: 82.5 kg 83.2 kg 83.7 kg    Examination:  General: No acute distress. Cardiovascular: RRR Lungs: unlabored Abdomen: appropriately tender Neurological: Alert and oriented 3. Moves all extremities 4. Cranial nerves II through XII grossly intact. Skin: Warm and dry. No rashes or lesions. Extremities: No clubbing or cyanosis. No edema.    Data Reviewed: I have personally reviewed following labs and imaging studies  CBC: Recent Labs  Lab 08/05/21 0425 08/06/21 0415 08/07/21 0401 08/08/21 0411 08/09/21 0418  WBC 4.6 13.0* 11.4* 7.3 5.7  NEUTROABS  --   --   --  4.3 3.0  HGB 9.7* 10.6* 10.2* 10.0* 9.5*  HCT 30.6* 33.2* 33.3* 32.8* 31.3*  MCV 76.7* 76.0* 76.6* 78.3* 78.4*  PLT 332 320 326 323 681    Basic Metabolic Panel: Recent Labs  Lab 08/05/21 0425 08/06/21 0415 08/07/21 0401 08/08/21 0411 08/09/21 0418  NA 140 133* 138 137 136  K 3.5 3.7 3.8 3.7 3.6  CL 109 104 106 104 104  CO2 25 22 25 27 25   GLUCOSE 121* 258* 194* 177* 207*  BUN 6* 7* 9 9 12   CREATININE 0.70 0.60 0.64 0.73 0.66  CALCIUM 8.9 8.8* 9.0 8.8* 9.0   MG 1.9 2.0 2.2 2.0 1.7  PHOS  --   --   --  2.3* 3.8    GFR: Estimated Creatinine Clearance: 62.2 mL/min (by C-G formula based on SCr of 0.66 mg/dL).  Liver Function Tests: Recent Labs  Lab 08/08/21 0411 08/09/21 0418  AST 18 14*  ALT 14 11  ALKPHOS 62 61  BILITOT 0.6 0.4  PROT 7.0 6.6  ALBUMIN 3.0* 2.8*    CBG: Recent Labs  Lab 08/08/21 1157 08/08/21 1742 08/08/21 2139 08/09/21 0758 08/09/21 1151  GLUCAP 215* 136* 172* 181* 230*     Recent Results (from the past 240 hour(s))  Urine Culture     Status: None   Collection Time: 08/01/21  6:24 PM   Specimen: Urine, Clean Catch  Result Value Ref Range Status   Specimen Description   Final    URINE, CLEAN CATCH Performed at [facility], [place] [address], [place], [place] [address]    Special Requests   Final    NONE Performed at [facility], [place] [address], [place], [place] [address]    Culture   Final    NO GROWTH Performed at [facility], [place] [address], [place], [place] [address]    Report Status 08/03/2021 FINAL  Final  Resp Panel by RT-PCR (Flu Kylina Vultaggio&B, Covid) Nasopharyngeal Swab     Status: None   Collection Time: 08/01/21  7:00 PM   Specimen: Nasopharyngeal Swab; Nasopharyngeal(NP) swabs in vial transport medium  Result Value Ref Range Status   SARS Coronavirus 2 by RT PCR NEGATIVE NEGATIVE Final    Comment: (NOTE) SARS-CoV-2 target nucleic acids are NOT DETECTED.  The SARS-CoV-2 RNA is generally detectable in upper respiratory specimens during the acute phase of infection. The lowest concentration of SARS-CoV-2 viral copies this assay can detect is 138 copies/mL. Julyssa Kyer negative result does not preclude SARS-Cov-2 infection and should not be used as the sole basis for treatment or other patient management decisions. Anesa Fronek negative result may occur with  improper specimen collection/handling, submission of specimen other than nasopharyngeal swab, presence of viral  mutation(s) within the areas targeted by this assay, and inadequate number of viral copies(<138 copies/mL). Candid Bovey negative result must be combined with clinical observations, patient history, and epidemiological information. The expected result is Negative.  Fact Sheet for Patients:  EntrepreneurPulse.com.au  Fact Sheet for Healthcare Providers:  IncredibleEmployment.be  This test is no t yet approved or cleared by the [place] FDA and  has been authorized for detection and/or diagnosis of SARS-CoV-2 by FDA under an Emergency Use Authorization (EUA). This EUA will remain  in effect (meaning this test can be used) for the duration of the COVID-19 declaration under Section 564(b)(1) of the Act, 21 U.S.C.section 360bbb-3(b)(1), unless the authorization is terminated  or revoked sooner.       Influenza Venissa Nappi by PCR NEGATIVE NEGATIVE Final   Influenza B by PCR NEGATIVE NEGATIVE Final    Comment: (NOTE) The Xpert Xpress SARS-CoV-2/FLU/RSV plus assay is intended as an aid in the diagnosis of influenza from Nasopharyngeal swab specimens and should not be used as Samika Vetsch sole basis for treatment. Nasal washings and aspirates are unacceptable for Xpert Xpress SARS-CoV-2/FLU/RSV testing.  Fact Sheet for Patients: EntrepreneurPulse.com.au  Fact Sheet for Healthcare Providers: IncredibleEmployment.be  This test is not yet approved or cleared by the [place] FDA and has been authorized for detection and/or diagnosis of SARS-CoV-2 by FDA under an Emergency Use Authorization (EUA). This EUA will remain in effect (meaning this test can be used) for the duration of the COVID-19 declaration under Section 564(b)(1) of the Act, 21 U.S.C. section 360bbb-3(b)(1), unless the authorization is terminated or revoked.  Performed at [facility], [place] [address] [place],  [address]          Radiology  Studies: No results found.      Scheduled Meds:  acetaminophen (TYLENOL) oral liquid 160 mg/5 mL  1,000 mg Oral Q8H   enoxaparin (LOVENOX) injection  40 mg Subcutaneous Q24H   feeding supplement  237 mL Oral BID BM   insulin aspart  0-5 Units Subcutaneous QHS   insulin aspart  0-9 Units Subcutaneous TID WC   metFORMIN  500 mg Oral Q breakfast  pantoprazole  40 mg Oral BID   Continuous Infusions:  sodium chloride       LOS: 6 days    Time spent: over 30 min    Fayrene Helper, MD [facility]   To contact the attending provider between 7A-7P or the covering provider during after hours 7P-7A, please log into the web site [email or web address] and access using universal [place] password for that web site. If you do not have the password, please call the hospital operator.  08/09/2021, 3:03 PM

## 2021-08-09 NOTE — Discharge Instructions (Signed)
[facility]      [facility] [phone]  OPEN ABDOMINAL SURGERY: POST OP INSTRUCTIONS  Always review your discharge instruction sheet given to you by the facility where your surgery was performed.  IF YOU HAVE DISABILITY OR FAMILY LEAVE FORMS, YOU MUST BRING THEM TO THE OFFICE FOR PROCESSING.  PLEASE DO NOT GIVE THEM TO YOUR DOCTOR.  A prescription for pain medication may be given to you upon discharge.  Take your pain medication as prescribed, if needed.  If narcotic pain medicine is not needed, then you may take acetaminophen (Tylenol) or ibuprofen (Advil) as needed. Take your usually prescribed medications unless otherwise directed. If you need a refill on your pain medication, please contact your pharmacy. They will contact our office to request authorization.  Prescriptions will not be filled after 5pm or on week-ends. You should follow a light diet the first few days after arrival home, such as soup and crackers, pudding, etc.unless your doctor has advised otherwise. A high-fiber, low fat diet can be resumed as tolerated.   Be sure to include lots of fluids daily. Most patients will experience some swelling and bruising on the chest and neck area.  Ice packs will help.  Swelling and bruising can take several days to resolve Most patients will experience some swelling and bruising in the area of the incision. Ice pack will help. Swelling and bruising can take several days to resolve..  It is common to experience some constipation if taking pain medication after surgery.  Increasing fluid intake and taking a stool softener will usually help or prevent this problem from occurring.  A mild laxative (Milk of Magnesia or Miralax) should be taken according to package directions if there are no bowel movements after 48 hours.  You may have steri-strips (small skin tapes) in place directly over the incision.  These strips should be left on the skin for 7-10 days.  If your surgeon used skin  glue on the incision, you may shower in 24 hours.  The glue will flake off over the next 2-3 weeks.  Any sutures or staples will be removed at the office during your follow-up visit. You may find that a light gauze bandage over your incision may keep your staples from being rubbed or pulled. You may shower and replace the bandage daily. ACTIVITIES:  You may resume regular (light) daily activities beginning the next day--such as daily self-care, walking, climbing stairs--gradually increasing activities as tolerated.  You may have sexual intercourse when it is comfortable.  Refrain from any heavy lifting or straining until approved by your doctor. You may drive when you no longer are taking prescription pain medication, you can comfortably wear a seatbelt, and you can safely maneuver your car and apply brakes Return to Work: ___________________________________ You should see your doctor in the office for a follow-up appointment approximately two weeks after your surgery.  Make sure that you call for this appointment within a day or two after you arrive home to insure a convenient appointment time. OTHER INSTRUCTIONS:  _____________________________________________________________ _____________________________________________________________  WHEN TO CALL YOUR DOCTOR: Fever over 101.0 Inability to urinate Nausea and/or vomiting Extreme swelling or bruising Continued bleeding from incision. Increased pain, redness, or drainage from the incision. Difficulty swallowing or breathing Muscle cramping or spasms. Numbness or tingling in hands or feet or around lips.  The clinic staff is available to answer your questions during regular business hours.  Please don't hesitate to call and ask to speak to one of   heridas abiertas todos los das. Dchate todos los das. Los  baos cortos estn bien. Lave las incisiones y heridas con agua y jabn. Puede dejar las incisiones cerradas abiertas al aire si est seco. Puede cubrir la incisin con una gasa limpia y volver a colocarla despus de su ducha diaria para mayor comodidad.  Si tiene una herida abierta con una aspiradora para heridas, consulte las instrucciones de cuidado de la aspiradora para heridas.   ACTIVIDADES segn tolerancia Inicie actividades diarias livianas (cuidado personal, caminar, subir escaleras) a partir del da posterior a la ciruga. Aumente gradualmente las actividades segn lo tolere. Controla tu dolor para estar activo. Detngase cuando est cansado. Lo ideal es caminar varias veces al da, eventualmente una hora al da. La mayora de las personas regresan a la mayora de las actividades cotidianas en unas pocas semanas. Se necesitan de 4 a 8 semanas para volver a la actividad intensa y sin restricciones. Si puede caminar 30 minutos sin dificultad, es seguro intentar una actividad ms intensa como trotar, caminar en una caminadora, andar en bicicleta, ejercicios aerbicos de bajo impacto, nadar, etc. Guarde la actividad ms intensa y extenuante para el final (por lo general, de 4 a 8 semanas despus de la ciruga), como abdominales, levantar objetos pesados, deportes de contacto, etc. Abstngase de cualquier trabajo pesado intenso o esfuerzo hasta que deje de tomar narcticos para controlar el dolor. Tendr das libres, pero las cosas deberan mejorar semana a semana. NO EMPUJE A TRAVS DEL DOLOR. Deja que el dolor sea tu gua: si te duele hacer algo, no lo hagas. El dolor es tu cuerpo advirtindote que evites esa actividad durante otra semana hasta que el dolorbaja. Puede conducir cuando ya no est tomando medicamentos narcticos recetados para el dolor, puede usar cmodamente el cinturn de seguridad y puede hacer giros/paradas repentinos de manera segura para protegerse sin vacilar debido al  dolor. Puede tener relaciones sexuales cuando le resulte cmodo. Si te duele hacer algo, detente.  MEDICAMENTOS Tome los medicamentos que le recetan normalmente en casa, a menos que le indiquen lo contrario. Anticoagulantes: Por lo general, puede reiniciar cualquier anticoagulante fuerte despus del segundo da posoperatorio. Est bien tomar una aspirina de inmediato.   Si est tomando anticoagulantes fuertes (warfarina/Coumadin, Plavix, Xerelto, Eliquis, Pradaxa, etc.), hable con su cirujano, mdico de atencin primaria y/o cardilogo para obtener instrucciones sobre cundo reiniciar el anticoagulante y si es necesario controlar la sangre. (anlisis de sangre PT/INR, etc.).  CONTROL DE DOLOR El dolor despus de la ciruga o relacionado con la actividad a menudo se debe a una tensin/lesin en el msculo, tendn, nervios y/o incisiones. Este dolor suele ser a corto plazo y mejorar en unos pocos meses. Para ayudar a acelerar el proceso de curacin y volver a la actividad normal ms rpidamente, HAGAN LAS SIGUIENTES COSAS JUNTOS: 1. Aumente la actividad gradualmente. NO EMPUJE A TRAVS DEL DOLOR 2. Usa Hielo y/o Calor 3. Prueba masajes suaves y/o estiramientos 4. Tome analgsicos de venta libre 5. Tome analgsicos recetados con narcticos para el dolor ms intenso  Buen control del dolor = recuperacin ms rpida. Es mejor tomar ms medicamentos para estar ms activo que quedarse en cama todo el da para evitar los medicamentos. 1. Incrementa la actividad gradualmente Evite levantar objetos pesados al principio, luego aumente a levantar segn lo tolere durante las prximas 6 semanas. No "empuje a travs" del dolor. Escucha a tu cuerpo y evita posiciones y maniobras que reproduzcan el dolor. Espera unos das antes de probar   algo ms intenso. Se recomienda caminar una hora al da para ayudar a que su cuerpo se recupere ms rpido y de manera ms segura. Comience lentamente y detngase cuando  sienta dolor. Si puedes caminar 30 minutos sin parar ni sentir dolor, puedes probar con actividades ms intensas (correr, trotar, aerbicos, andar en bicicleta, nadar, caminadora, sexo, deportes, levantamiento de pesas, etc.) Recuerda: si te duele hacerlo, no lo hagas! 2. Usa Hielo y/o Calor Tendr hinchazn y moretones alrededor de las incisiones. Esto tardar varias semanas en resolverse. Bolsas de hielo o almohadillas trmicas (6 a 8 veces al da, 30 a 60 minutos cada vez) ayudarn a aliviar el dolor y los moretones. Algunas personas prefieren usar hielo solo, calor solo o alternar entre hielo y calor. Experimente y vea qu funciona mejor para usted. Considere probar el hielo durante los primeros das para ayudar a disminuir la hinchazn y los moretones; luego, cambie a calor para ayudar a relajar los puntos doloridos y acelerar la recuperacin. Dchate todos los das. Los baos cortos estn bien. Se siente bien! Mantenga las incisiones y heridas limpias con agua y jabn. 3. Prueba masajes suaves y/o estiramientos Masaje en el rea del dolor muchas veces al da Detente si sientes dolor, no te excedas. 4. Tome analgsicos de venta libre Esto ayuda a que los tejidos musculares y nerviosos se vuelvan menos irritables y se calmen ms rpido. Elija UNO de los siguientes medicamentos antiinflamatorios de venta libre: Pastillas de 500 mg de acetaminofn (Tylenol) 1 o 2 pastillas con cada comida y justo antes de acostarse (evtelo si tiene problemas hepticos o si tiene acetaminofn en su receta de narcticos) Tabletas de naproxeno de 220 mg (p. ej., Aleve, Naprosyn) 1 o 2 tabletas dos veces al da (evtela si tiene problemas de rin, estmago, EII o sangrado) Pastillas de 200 mg de ibuprofeno (p. ej., Advil, Motrin) 3 o 4 pastillas con cada comida y justo antes de acostarse (evtelo si tiene problemas de rin, estmago, EII o sangrado) Tmelo con comida/refrigerio varias veces al da segn las  indicaciones durante al menos 2 semanas para ayudar a mantener el dolor/dolor bajo y ms manejable. 5. Tome analgsicos recetados con narcticos para el dolor ms intenso A menudo se le da una receta para un fuerte control del dolor despus del alta (por ejemplo: oxicodona/Percocet, hidrocodona/Norco/Vicodin o tramadol/Ultram) Tome su medicamento para el dolor segn lo prescrito. Tenga en cuenta que la mayora de las recetas de narcticos tambin contienen Tylenol (acetaminofeno); evite tomar demasiado Tylenol. Si tiene problemas/inquietudes con el medicamento recetado (no controla el dolor, las nuseas, los vmitos, el sarpullido, la picazn, etc.), llmenos al [phone] para ver si necesitamos cambiarlo por otro para el dolor. medicamento que funcionar mejor para usted y/o controlar mejor sus efectos secundarios. Si necesita un resurtido de su medicamento para el dolor, debe llamar a la oficina antes de las 4:00 p. m. y solo entre semana. Por ley federal, las recetas de narcticos no se pueden pedir en una farmacia. Deben ser llenados en papel y recogidos en nuestra oficina por el paciente o cuidador autorizado. Las recetas no se pueden surtir despus de las 4 pm ni los fines de semana.  CUANDO LLAMARNOS [phone] Dolor intenso no controlado o que empeora Fiebre superior a 101 F (38,5 C) Inquietudes con la incisin: Empeoramiento del dolor, enrojecimiento, sarpullido/urticaria, hinchazn, sangrado o drenaje Reacciones/problemas con nuevos medicamentos (picazn, sarpullido, urticaria, nuseas, etc.) Nuseas y d/o vmitos Dificultad para orinar Respiracin dificultosa Empeoramiento de la fatiga,   mareos, aturdimiento, visin borrosa Otras preocupaciones Si no mejora despus de dos semanas o nota que empeora, comunquese con nuestra oficina al [phone] para obtener ms consejos. Es posible que necesitemos ajustar sus medicamentos, volver a evaluarlo en el consultorio, enviarlo a la  sala de emergencias o ver qu otras cosas podemos hacer para ayudarlo. El personal de la clnica est disponible para responder sus preguntas durante el horario comercial habitual (8:30 a. m. a 5 p. m.). No dude en llamar y pedir hablar con uno de nuestros enfermeros si tiene inquietudes clnicas. Un cirujano de [facility] est siempre de guardia en los hospitales las 24 horas del da.  Si tiene una emergencia mdica, vaya a la sala de emergencias ms cercana o llame al 911.  SEGUIMIENTO en nuestra oficina El da de su alta del hospital (o el siguiente da laborable de la semana), llame a [facility] para programar o confirmar una cita para ver a su cirujano en el consultorio para una cita de seguimiento. Por lo general, es de 2 a 3 semanas despus de la ciruga. Si tiene grapas para la piel en su(s) incisin(es), infrmele al consultorio para que podamos programar un horario en el consultorio para que la enfermera se las quite (generalmente alrededor de 10 das despus de la ciruga). Asegrese de llamar para programar citas el da del alta (o el siguiente da laborable de la semana) del hospital para garantizar una hora de cita conveniente. SI TIENE FORMULARIOS DE DISCAPACIDAD O LICENCIA FAMILIAR, LLEVARLOS A LA OFICINA PARA PROCESARLOS. NO SE LOS D A SU MDICO.  [facility] [place] [address], [address], [place], [place] [address] ? [phone] - Principal [phone] - Nmero gratuito, [phone] - Fax [email or web address]   LLEGAR A UNA BUENA SALUD INTESTINAL. Se espera que su tracto digestivo necesite algunos meses para volver a la normalidad. Es comn que sus evacuaciones intestinales y heces sean irregulares. Tendr hinchazn y calambres ocasionales que eventualmente desaparecern. Hasta que est comiendo alimentos slidos normalmente, deje de tomar todos los medicamentos para el dolor y regrese a sus actividades  regulares; sus intestinos no sern normales. Evitar el estreimiento El objetivo: UNA EVACUACIN INTESTINAL SUAVE AL DA! Beber mucho lquido. Elige agua primero. TOMA UN SUPLEMENTO DE FIBRA CADA DA EL RESTO DE TU VIDA Durante su primera semana de regreso a casa, agregue gradualmente un suplemento de fibra todos los das. Experimente qu forma puede tolerar. Hay muchas formas, como polvos, tabletas, obleas, gomitas, etc. Salvado de psyllium (Metamucil), metilcelulosa (Citrucel), Miralax o Glycolax, Benefiber, Linaza. Ajuste la dosis semana a semana (1/2 dosis/da a 6 dosis al da) hasta que est defecando 1-2 veces al da. Reduzca la dosis o pruebe con un producto de fibra diferente si le causa problemas como diarrea o distensin abdominal. A veces, se necesita un laxante para ayudar a impulsar los intestinos si est estreido hasta que el suplemento de fibra pueda ayudar a regular sus intestinos. Si tolera comer y se est tirando pedos, est bien probar un laxante suave como la dosis doble de MiraLax, jugo de ciruela pasa o leche de magnesia. Evite el uso de laxantes con demasiada frecuencia. Los ablandadores de heces a veces pueden ayudar a contrarrestar los efectos de estreimiento de los analgsicos narcticos. Tambin puede causar diarrea, as que evite usarlo por mucho tiempo. Si todava est estreido a pesar de tomar fibra diariamente, comer slidos y algunas dosis de laxantes, llame a nuestra oficina. Controlando la diarrea Intente

## 2021-08-10 ENCOUNTER — Telehealth: Payer: Self-pay | Admitting: Hematology and Oncology

## 2021-08-10 LAB — GLUCOSE, CAPILLARY: Glucose-Capillary: 179 mg/dL — ABNORMAL HIGH (ref 70–99)

## 2021-08-10 MED ORDER — METFORMIN HCL 500 MG PO TABS: 500.0000 mg | ORAL_TABLET | Freq: Every day | ORAL | 0 refills | Status: AC

## 2021-08-10 NOTE — Telephone Encounter (Signed)
Scheduled appt per 11/18 staff msg. Pt is aware of appt date and time.

## 2021-08-10 NOTE — Progress Notes (Signed)
Discharge instructions given to patient and all questions were answered.  

## 2021-08-10 NOTE — Discharge Summary (Signed)
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 is [name] 80 yo female with PMH afib, pseudophakia both eyes, hemorrhoids who presented to the ER with mild abdominal discomfort for at least the past 3 weeks.  She did have 1 episode of bloody stools just prior to admission.     Patient reports 60-month history of right lower quadrant abdominal pain and generalized weakness in the morning after she wakes up.  Also episodes of nausea but no vomiting.  Symptoms usually improve after she eats something.  Also had Navika Hoopes few episodes of dark stools.  Reports lightheadedness in the morning and also dyspnea with exertion.  Patient states she had bloody stools 3 months ago which she thought were due to her eating Doran Nestle lot of popcorn.  Her PCP recommended colonoscopy but it has not been done yet.   She thinks she had Tamon Parkerson colonoscopy several years ago but cannot recall when or findings.    CT abdomen/pelvis was performed on admission which showed no acute findings. Hemoglobin was noted to be 7.4 g/dL.  No recent labs in epic for comparison.  Last noted labs are from 2011 at which time hemoglobin was 11 to 12 g/dL. She was transfused 1 unit PRBC on admission.   She  underwent colonoscopy and EGD on 08/03/2021.  Colonoscopy was notable for an infiltrative and nonobstructing cecal mass.  General surgery was then consulted for further evaluation.  She's now s/p right colectomy on 08/05/2021.  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  Surgical path with invasive colorectal adenocarcinoma.  She's improved after surgery.  Plan for outpatient surgery and oncology follow up.  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[facility]) Initially presented with symptomatic anemia and RLQ pain On colonoscopy, noted to have infiltrative, ulcerated, nonobstructing cecal mass on 11/14 Path with invasive moderately differentiated adenocarcinoma Now s/p open right colectomy 11/16 Follow surgical path -> invasive colorectal adenocarcinoma, tubular adenoma, unremarkable proximal small intestinal and distal colonic resection margins  CT chest without contrast without evidence of metastatic disease CT abd pelvis without contrast without acute findings  Appreciate surgery assistance, advancing to soft diet  Discussed with Dr. Lorenso Courier 11/18 from oncology, he noted he'll arrange outpatient oncology follow up   Symptomatic anemia Hb on admit 7.4 S/p 2 units prbc Trend and transfuse as needed 2/2 cecal mass as noted above   GI bleed 2/2 adenocarcinoma of cecum  Abnormal urinalysis Workup further if symptomatic  Type 2 diabetes mellitus without complication ([place]) R7E 7.5 Start metformin, follow outpatient   Procedures: 11/16 open right colectomy  11/14 EGD and colonoscopy    Consultations: Surgery Oncology over phone  Discharge Exam: Vitals:   08/09/21 2137 08/10/21 0555  BP: (!) 139/58 (!) 129/57  Pulse: 73 70  Resp: 18 18  Temp: 98.5 F (  36.9 C) 98.5 F (36.9 C)  SpO2: 99% 98%   Feels ready for discharge Discussed with daughter at bedside  General: No acute distress. Cardiovascular: RRR Lungs: unlabored Abdomen:  appropriately tender to palpation Neurological: Alert and oriented 3. Moves all extremities 4 . Cranial nerves II through XII grossly intact. Skin: Warm and dry. No rashes or lesions. Extremities: No clubbing or cyanosis. No edema.  Discharge Instructions   Discharge Instructions     Call MD for:  difficulty breathing, headache or visual disturbances   Complete by: As directed    Call MD for:  extreme fatigue   Complete by: As directed    Call MD for:  hives   Complete by: As directed    Call MD for:  persistant dizziness or light-headedness   Complete by: As directed    Call MD for:  persistant nausea and vomiting   Complete by: As directed    Call MD for:  redness, tenderness, or signs of infection (pain, swelling, redness, odor or green/yellow discharge around incision site)   Complete by: As directed    Call MD for:  severe uncontrolled pain   Complete by: As directed    Call MD for:  temperature >100.4   Complete by: As directed    Diet - low sodium heart healthy   Complete by: As directed    Discharge instructions   Complete by: As directed    You were seen for anemia (low blood counts) and were found to have [name] cecal adenocarcinoma.  You've now had Johnanna Bakke colectomy by surgery.  You'll need to follow up with surgery as an outpatient (Dr. Harlow Asa).  You'll also need to follow up with the cancer doctors (oncology) as an outpatient for your new diagnosis of cancer.  You've improved gradually after the surgery.  Follow the surgeons wound care instructions.  You have elevated blood sugars/diabetes.  We've started you on metformin at discharge.   Follow with your PCP as an outpatient for further management.   Return for new, recurrent, or worsening symptoms.  Please ask your PCP to request records from this hospitalization so they know what was done and what the next steps will be.   Discharge wound care:   Complete by: As directed    Follow surgery wound care instructions    Increase activity slowly   Complete by: As directed       Allergies as of 08/10/2021       Reactions   Iodinated Diagnostic Agents Other (See Comments)   Bag of oil formed, thought it was Preslee Regas goiter   Penicillins Hives        Medication List     TAKE these medications    acetaminophen 160 MG/5ML solution Commonly known as: TYLENOL Take 31.3 mLs (1,000 mg total) by mouth every 8 (eight) hours for 10 days.   cholecalciferol 25 MCG (1000 UNIT) tablet Commonly known as: VITAMIN D3 Take 1,000 Units by mouth every other day.   docusate sodium 100 MG capsule Commonly known as: Colace Take 1 capsule (100 mg total) by mouth 2 (two) times daily as needed for mild constipation or moderate constipation.   hydrocortisone 25 MG suppository Commonly known as: ANUSOL-HC Place 1 suppository (25 mg total) rectally 2 (two) times daily.   metFORMIN 500 MG tablet Commonly known as: GLUCOPHAGE Take 1 tablet (500 mg total) by mouth daily with breakfast. Start taking on: August 11, 2021   Multivitamin Adult Tabs Take 1 tablet by  mouth daily.   traMADol 50 MG tablet Commonly known as: Ultram Take 1 tablet (50 mg total) by mouth every 6 (six) hours as needed for up to 5 days for moderate pain or severe pain.               Discharge Care Instructions  (From admission, onward)           Start     Ordered   08/10/21 0000  Discharge wound care:       Comments: Follow surgery wound care instructions   08/10/21 1036           Allergies  Allergen Reactions   Iodinated Diagnostic Agents Other (See Comments)    Bag of oil formed, thought it was [place] Emmitt goiter   Penicillins Hives    Follow-up Information     Orson Slick, MD Follow up.   Specialty: Hematology and Oncology Contact information: [place] [address] [phone]         Surgery, [place]. Call.   Specialty: General Surgery Why: We are working to make your nurse visit  appointment for staple removal. Please call to confirm appointment date and time Contact information: [place] [address] [phone]         Armandina Gemma, MD. Call.   Specialty: General Surgery Why: We are working to make your surgical follow up appointment. Please call to confirm appointment date and time Contact information: [address] [place] [address] [phone]                  The results of significant diagnostics from this hospitalization (including imaging, microbiology, ancillary and laboratory) are listed below for reference.    Significant Diagnostic Studies: CT ABDOMEN PELVIS WO CONTRAST  Result Date: 08/01/2021 CLINICAL DATA:  Abdominal pain EXAM: CT ABDOMEN AND PELVIS WITHOUT CONTRAST TECHNIQUE: Multidetector CT imaging of the abdomen and pelvis was performed following the standard protocol without IV contrast. COMPARISON:  CT abdomen and pelvis dated October 08, 2009 FINDINGS: Lower chest: Mitral annular calcifications. Moderate hiatal hernia. No acute abnormality. Hepatobiliary: Low-attenuation lesion of the left hepatic lobe is unchanged compared to 2011 prior and likely Illyria Sobocinski simple cyst. No suspicious liver lesions. Gallbladder is unremarkable. No biliary ductal dilation. Pancreas: Unremarkable. No pancreatic ductal dilatation or surrounding inflammatory changes. Spleen: Normal in size without focal abnormality. Adrenals/Urinary Tract: Adrenal glands are unremarkable. Kidneys are normal, without renal calculi, focal lesion, or hydronephrosis. Bladder is unremarkable for degree of decompression. Stomach/Bowel: Stomach is within normal limits. Appendix is not definitely visualized. No evidence of bowel wall thickening, distention, or inflammatory changes. Vascular/Lymphatic: Aortic atherosclerosis. No enlarged abdominal or pelvic lymph nodes. Reproductive: No adnexal masses. Other: No abdominal wall hernia or  abnormality. No abdominopelvic ascites. Musculoskeletal: No acute or significant osseous findings. IMPRESSION: 1. No acute findings in the abdomen or pelvis. 2. Aortic Atherosclerosis (ICD10-I70.0). Electronically Signed   By: Yetta Glassman M.D.   On: 08/01/2021 19:32   CT CHEST WO CONTRAST  Result Date: 08/04/2021 CLINICAL DATA:  Colorectal cancer, evaluate for meds EXAM: CT CHEST WITHOUT CONTRAST TECHNIQUE: Multidetector CT imaging of the chest was performed following the standard protocol without IV contrast. COMPARISON:  Chest radiograph 06/21/2020 FINDINGS: Cardiovascular: The heart is not enlarged. There is no pericardial effusion. There are mild mitral annular calcifications. There is mild calcified atherosclerotic plaque of the thoracic aorta. Mediastinum/Nodes: The imaged thyroid is unremarkable. The  esophagus is grossly unremarkable. There is Frutoso Dimare small hiatal hernia. There is no mediastinal or axillary lymphadenopathy. There is no bulky hilar lymphadenopathy. Lungs/Pleura: The trachea and central airways are patent. The lungs are well inflated. There is no focal consolidation or pulmonary edema. There is no pleural effusion or pneumothorax. There is minimal scarring in the left lung base and right lower lobe. There are no suspicious nodules or masses. Upper Abdomen: Emiko Osorto small cyst is again seen in the liver. The imaged portions of the upper abdominal viscera are otherwise unremarkable. Musculoskeletal: There is no acute osseous abnormality or aggressive osseous lesion. IMPRESSION: No evidence of metastatic disease or pathologic lymphadenopathy in the chest. Electronically Signed   By: Valetta Mole M.D.   On: 08/04/2021 15:47    Microbiology: Recent Results (from the past 240 hour(s))  Urine Culture     Status: None   Collection Time: 08/01/21  6:24 PM   Specimen: Urine, Clean Catch  Result Value Ref Range Status   Specimen Description   Final    URINE, CLEAN CATCH Performed at [facility], [place] [address] [place], [place] [address]    Special Requests   Final    NONE Performed at [facility], [place] [address], [place], [place] [address]    Culture   Final    NO GROWTH Performed at [facility], [place] [address] [place], [place] [address]    Report Status 08/03/2021 FINAL  Final  Resp Panel by RT-PCR (Flu Chalise Pe&B, Covid) Nasopharyngeal Swab     Status: None   Collection Time: 08/01/21  7:00 PM   Specimen: Nasopharyngeal Swab; Nasopharyngeal(NP) swabs in vial transport medium  Result Value Ref Range Status   SARS Coronavirus 2 by RT PCR NEGATIVE NEGATIVE Final    Comment: (NOTE) SARS-CoV-2 target nucleic acids are NOT DETECTED.  The SARS-CoV-2 RNA is generally detectable in upper respiratory specimens during the acute phase of infection. The lowest concentration of SARS-CoV-2 viral copies this assay can detect is 138 copies/mL. Shalene Gallen negative result does not preclude SARS-Cov-2 infection and should not be used as the sole basis for treatment or other patient management decisions. Parker Wherley negative result may occur with  improper specimen collection/handling, submission of specimen other than nasopharyngeal swab, presence of viral mutation(s) within the areas targeted by this assay, and inadequate number of viral copies(<138 copies/mL). Gelsey Amyx negative result must be combined with clinical observations, patient history, and epidemiological information. The expected result is Negative.  Fact Sheet for Patients:  EntrepreneurPulse.com.au  Fact Sheet for Healthcare Providers:  IncredibleEmployment.be  This test is no t yet approved or cleared by the [place] FDA and  has been authorized for detection and/or diagnosis of SARS-CoV-2 by FDA under an Emergency Use Authorization (EUA). This EUA will remain  in effect (meaning this test can be used) for the duration of the COVID-19 declaration under Section  564(b)(1) of the Act, 21 U.S.C.section 360bbb-3(b)(1), unless the authorization is terminated  or revoked sooner.       Influenza Roshana Shuffield by PCR NEGATIVE NEGATIVE Final   Influenza B by PCR NEGATIVE NEGATIVE Final    Comment: (NOTE) The Xpert Xpress SARS-CoV-2/FLU/RSV plus assay is intended as an aid in the diagnosis of influenza from Nasopharyngeal swab specimens and should not be used as Angie Piercey sole basis for treatment. Nasal washings and aspirates are unacceptable for Xpert Xpress SARS-CoV-2/FLU/RSV testing.  Fact Sheet for Patients: EntrepreneurPulse.com.au  Fact Sheet for Healthcare Providers: IncredibleEmployment.be  This test is not yet approved or cleared by the [place] and has been authorized for detection and/or diagnosis of SARS-CoV-2 by FDA under an Emergency Use Authorization (EUA). This EUA will remain in effect (meaning this test can be used) for the duration of the COVID-19 declaration under Section 564(b)(1) of the Act, 21 U.S.C. section 360bbb-3(b)(1), unless the authorization is terminated or revoked.  Performed at [facility], [place] [address], [place], [place] [address]      Labs: Basic Metabolic Panel: Recent Labs  Lab 08/05/21 0425 08/06/21 0415 08/07/21 0401 08/08/21 0411 08/09/21 0418  NA 140 133* 138 137 136  K 3.5 3.7 3.8 3.7 3.6  CL 109 104 106 104 104  CO2 25 22 25 27 25   GLUCOSE 121* 258* 194* 177* 207*  BUN 6* 7* 9 9 12   CREATININE 0.70 0.60 0.64 0.73 0.66  CALCIUM 8.9 8.8* 9.0 8.8* 9.0  MG 1.9 2.0 2.2 2.0 1.7  PHOS  --   --   --  2.3* 3.8   Liver Function Tests: Recent Labs  Lab 08/08/21 0411 08/09/21 0418  AST 18 14*  ALT 14 11  ALKPHOS 62 61  BILITOT 0.6 0.4  PROT 7.0 6.6  ALBUMIN 3.0* 2.8*   No results for input(s): LIPASE, AMYLASE in the last 168 hours. No results for input(s): AMMONIA in the last 168 hours. CBC: Recent Labs  Lab 08/05/21 0425 08/06/21 0415  08/07/21 0401 08/08/21 0411 08/09/21 0418  WBC 4.6 13.0* 11.4* 7.3 5.7  NEUTROABS  --   --   --  4.3 3.0  HGB 9.7* 10.6* 10.2* 10.0* 9.5*  HCT 30.6* 33.2* 33.3* 32.8* 31.3*  MCV 76.7* 76.0* 76.6* 78.3* 78.4*  PLT 332 320 326 323 311   Cardiac Enzymes: No results for input(s): CKTOTAL, CKMB, CKMBINDEX, TROPONINI in the last 168 hours. BNP: BNP (last 3 results) Recent Labs    08/01/21 1900  BNP 52.1    ProBNP (last 3 results) No results for input(s): PROBNP in the last 8760 hours.  CBG: Recent Labs  Lab 08/09/21 0758 08/09/21 1151 08/09/21 1711 08/09/21 2235 08/10/21 0744  GLUCAP 181* 230* 121* 180* 179*       Signed:  Fayrene Helper MD.  [facility] 08/10/2021, 10:43 AM

## 2021-08-14 LAB — SURGICAL PATHOLOGY

## 2021-08-27 ENCOUNTER — Inpatient Hospital Stay: Payer: Medicare PPO

## 2021-08-27 ENCOUNTER — Inpatient Hospital Stay: Payer: Medicare PPO | Attending: Hematology and Oncology | Admitting: Hematology and Oncology

## 2021-08-27 ENCOUNTER — Other Ambulatory Visit: Payer: Self-pay

## 2021-08-27 VITALS — BP 132/92 | HR 82 | Temp 97.3°F | Resp 18 | Wt 188.6 lb

## 2021-08-27 DIAGNOSIS — Z801 Family history of malignant neoplasm of trachea, bronchus and lung: Secondary | ICD-10-CM | POA: Diagnosis not present

## 2021-08-27 DIAGNOSIS — Z9049 Acquired absence of other specified parts of digestive tract: Secondary | ICD-10-CM | POA: Insufficient documentation

## 2021-08-27 DIAGNOSIS — C189 Malignant neoplasm of colon, unspecified: Secondary | ICD-10-CM

## 2021-08-27 DIAGNOSIS — K5900 Constipation, unspecified: Secondary | ICD-10-CM | POA: Diagnosis not present

## 2021-08-27 DIAGNOSIS — Z800 Family history of malignant neoplasm of digestive organs: Secondary | ICD-10-CM | POA: Diagnosis not present

## 2021-08-27 DIAGNOSIS — D500 Iron deficiency anemia secondary to blood loss (chronic): Secondary | ICD-10-CM | POA: Diagnosis not present

## 2021-08-27 DIAGNOSIS — K649 Unspecified hemorrhoids: Secondary | ICD-10-CM | POA: Diagnosis not present

## 2021-08-27 DIAGNOSIS — C180 Malignant neoplasm of cecum: Secondary | ICD-10-CM | POA: Diagnosis present

## 2021-08-27 LAB — CMP (CANCER CENTER ONLY)
ALT: 8 U/L (ref 0–44)
AST: 16 U/L (ref 15–41)
Albumin: 3.5 g/dL (ref 3.5–5.0)
Alkaline Phosphatase: 78 U/L (ref 38–126)
Anion gap: 10 (ref 5–15)
BUN: 9 mg/dL (ref 8–23)
CO2: 23 mmol/L (ref 22–32)
Calcium: 9.4 mg/dL (ref 8.9–10.3)
Chloride: 103 mmol/L (ref 98–111)
Creatinine: 0.79 mg/dL (ref 0.44–1.00)
GFR, Estimated: >60 mL/min (ref >60)
Glucose, Bld: 158 mg/dL — ABNORMAL HIGH (ref 70–99)
Potassium: 4 mmol/L (ref 3.5–5.1)
Sodium: 136 mmol/L (ref 135–145)
Total Bilirubin: 0.5 mg/dL (ref 0.3–1.2)
Total Protein: 8 g/dL (ref 6.5–8.1)

## 2021-08-27 LAB — IRON AND TIBC
Iron: 87 ug/dL (ref 41–142)
Saturation Ratios: 22 % (ref 21–57)
TIBC: 400 ug/dL (ref 236–444)
UIBC: 313 ug/dL (ref 120–384)

## 2021-08-27 LAB — RETIC PANEL
Immature Retic Fract: 19.3 % — ABNORMAL HIGH (ref 2.3–15.9)
RBC.: 4.14 MIL/uL (ref 3.87–5.11)
Retic Count, Absolute: 29 10*3/uL (ref 19.0–186.0)
Retic Ct Pct: 0.7 % (ref 0.4–3.1)
Reticulocyte Hemoglobin: 29.7 pg (ref >27.9)

## 2021-08-27 LAB — CBC WITH DIFFERENTIAL (CANCER CENTER ONLY)
Abs Immature Granulocytes: 0.04 10*3/uL (ref 0.00–0.07)
Basophils Absolute: 0 10*3/uL (ref 0.0–0.1)
Basophils Relative: 0 %
Eosinophils Absolute: 0 10*3/uL (ref 0.0–0.5)
Eosinophils Relative: 0 %
HCT: 32.1 % — ABNORMAL LOW (ref 36.0–46.0)
Hemoglobin: 10 g/dL — ABNORMAL LOW (ref 12.0–15.0)
Immature Granulocytes: 1 %
Lymphocytes Relative: 9 %
Lymphs Abs: 0.8 10*3/uL (ref 0.7–4.0)
MCH: 24.2 pg — ABNORMAL LOW (ref 26.0–34.0)
MCHC: 31.2 g/dL (ref 30.0–36.0)
MCV: 77.5 fL — ABNORMAL LOW (ref 80.0–100.0)
Monocytes Absolute: 0.4 10*3/uL (ref 0.1–1.0)
Monocytes Relative: 4 %
Neutro Abs: 7.4 10*3/uL (ref 1.7–7.7)
Neutrophils Relative %: 86 %
Platelet Count: 307 10*3/uL (ref 150–400)
RBC: 4.14 MIL/uL (ref 3.87–5.11)
RDW: 21.8 % — ABNORMAL HIGH (ref 11.5–15.5)
WBC Count: 8.6 10*3/uL (ref 4.0–10.5)
nRBC: 0 % (ref 0.0–0.2)

## 2021-08-27 LAB — FERRITIN: Ferritin: 34 ng/mL (ref 11–307)

## 2021-08-27 NOTE — Progress Notes (Signed)
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  Biopsy was obtained which confirmed adenocarcinoma of the colon. 08/05/2021: right colectomy performed, findings show invasive colorectal adenocarcinoma. Pathology review showed pT2, pN0 08/27/2021: Establish care with Dr. Lorenso Courier  CHIEF COMPLAINTS/PURPOSE OF CONSULTATION:  "Adenocarcinoma of the Cecum "  HISTORY OF PRESENTING ILLNESS:  Cordelia Pen 80 y.o. 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  On review of the previous records Mrs. Lazarz presented to the emergency department on 08/01/2021 with abdominal pain and an episode of bloody stool.  On 08/03/2021 the patient underwent a colonoscopy and upper endoscopy which found a mass in the cecum.  Biopsy was obtained consistent with adenocarcinoma the colon.  On 08/06/2019 the patient underwent a right colectomy with pathology findings showing a pT2, PN 0 adenocarcinoma of the colon.  CT scans of the chest abdomen and pelvis during the course of admission showed no evidence of metastatic disease.  Due to concern for these findings the patient was referred to oncology for further evaluation and management  On exam today Mrs. Wisinski reports that she has been struggling with constipation.  She reports that she has been quite good at "going every day" but that she messed up the other night when she went to a hibachi and that caused her to be bound up.  She notes that she has history of hemorrhoids  and has been causing some hemorrhoidal discomfort as well.  The patient reports that she is recovering well from the surgery otherwise and has not currently taking any pain medication.  She reports that she is only "sore".  Otherwise she is doing quite well.  Her appetite has been excellent and she has not been having any blood or dark stools.  She reports that she is not currently taking any iron pills but that she does like iron rich foods like spinach.  She has not been trying to eat red meat.  She endorses having "great energy".  On further discussion she reports that she has a maternal aunt with a history of colon cancer in mother who died of squamous cell cancer of the lung.  She has several nephews with type 2 diabetes.  She is a never smoker never drinker.  She otherwise denies any fevers, chills, sweats, nausea, vomiting or diarrhea.  A full 10 point ROS is listed below.  MEDICAL HISTORY:  No past medical history on file.  SURGICAL HISTORY: Past Surgical History:  Procedure Laterality Date   BIOPSY  08/03/2021   Procedure: BIOPSY;  Surgeon: Arta Silence, MD;  Location: [facility];  Service: Gastroenterology;;   COLONOSCOPY WITH PROPOFOL N/A 08/03/2021   Procedure: COLONOSCOPY WITH PROPOFOL;  Surgeon: Arta Silence, MD;  Location: [facility];  Service: Gastroenterology;  Laterality: N/A;   ESOPHAGOGASTRODUODENOSCOPY N/A 08/03/2021   Procedure: ESOPHAGOGASTRODUODENOSCOPY (EGD);  Surgeon: Arta Silence, MD;  Location: [facility];  Service: Gastroenterology;  Laterality: N/A;   PARTIAL COLECTOMY Right 08/05/2021   Procedure: RIGHT COLECTOMY;  Surgeon: Armandina Gemma, MD;  Location: [facility];  Service: General;  Laterality: Right;    SOCIAL HISTORY: Social History  Socioeconomic History   Marital status: Married    Spouse name: Not on file   Number of children: Not on file   Years of education: Not on file   Highest education level: Not on file  Occupational History   Not  on file  Tobacco Use   Smoking status: Never   Smokeless tobacco: Never  Substance and Sexual Activity   Alcohol use: No   Drug use: No   Sexual activity: Not on file  Other Topics Concern   Not on file  Social History Narrative   Not on file   Social Determinants of Health   Financial Resource Strain: Not on file  Food Insecurity: Not on file  Transportation Needs: Not on file  Physical Activity: Not on file  Stress: Not on file  Social Connections: Not on file  Intimate Partner Violence: Not on file    FAMILY HISTORY: No family history on file.  ALLERGIES:  is allergic to iodinated diagnostic agents and penicillins.  MEDICATIONS:  Current Outpatient Medications  Medication Sig Dispense Refill   cholecalciferol (VITAMIN D3) 25 MCG (1000 UNIT) tablet Take 1,000 Units by mouth every other day.     docusate sodium (COLACE) 100 MG capsule Take 1 capsule (100 mg total) by mouth 2 (two) times daily as needed for mild constipation or moderate constipation.     hydrocortisone (ANUSOL-HC) 25 MG suppository Place 1 suppository (25 mg total) rectally 2 (two) times daily. (Patient not taking: Reported on 08/01/2021) 12 suppository 0   metFORMIN (GLUCOPHAGE) 500 MG tablet Take 1 tablet (500 mg total) by mouth daily with breakfast. 30 tablet 0   Multiple Vitamin (MULTIVITAMIN ADULT) TABS Take 1 tablet by mouth daily.     No current facility-administered medications for this visit.    REVIEW OF SYSTEMS:   Constitutional: ( - ) fevers, ( - )  chills , ( - ) night sweats Eyes: ( - ) blurriness of vision, ( - ) double vision, ( - ) watery eyes Ears, nose, mouth, throat, and face: ( - ) mucositis, ( - ) sore throat Respiratory: ( - ) cough, ( - ) dyspnea, ( - ) wheezes Cardiovascular: ( - ) palpitation, ( - ) chest discomfort, ( - ) lower extremity swelling Gastrointestinal:  ( - ) nausea, ( - ) heartburn, ( - ) change in bowel habits Skin: ( - ) abnormal skin rashes Lymphatics: ( - )  new lymphadenopathy, ( - ) easy bruising Neurological: ( - ) numbness, ( - ) tingling, ( - ) new weaknesses Behavioral/Psych: ( - ) mood change, ( - ) new changes  All other systems were reviewed with the patient and are negative.  PHYSICAL EXAMINATION: ECOG PERFORMANCE STATUS: 1 - Symptomatic but completely ambulatory  Vitals:   08/27/21 1358  BP: (!) 132/92  Pulse: 82  Resp: 18  Temp: (!) 97.3 F (36.3 C)  SpO2: 100%   Filed Weights   08/27/21 1358  Weight: 188 lb 9 oz (85.5 kg)    GENERAL: well appearing elderly African-American female in NAD  SKIN: skin color, texture, turgor are normal, no rashes or significant lesions EYES: conjunctiva are pink and non-injected, sclera clear LUNGS: clear to auscultation and percussion with normal breathing effort HEART: regular rate & rhythm and no murmurs and no lower extremity edema ABDOMEN: Well-healing midline surgical scar from the umbilicus down.  Soft, non-tender, non-distended, normal bowel sounds Musculoskeletal: no cyanosis of digits and no clubbing  PSYCH: alert &  oriented x 3, fluent speech NEURO: no focal motor/sensory deficits  LABORATORY DATA:  I have reviewed the data as listed CBC Latest Ref Rng & Units 08/27/2021 08/09/2021 08/08/2021  WBC 4.0 - 10.5 K/uL 8.6 5.7 7.3  Hemoglobin 12.0 - 15.0 g/dL 10.0(L) 9.5(L) 10.0(L)  Hematocrit 36.0 - 46.0 % 32.1(L) 31.3(L) 32.8(L)  Platelets 150 - 400 K/uL 307 311 323    CMP Latest Ref Rng & Units 08/27/2021 08/09/2021 08/08/2021  Glucose 70 - 99 mg/dL 158(H) 207(H) 177(H)  BUN 8 - 23 mg/dL '9 12 9  ' Creatinine 0.44 - 1.00 mg/dL 0.79 0.66 0.73  Sodium 135 - 145 mmol/L 136 136 137  Potassium 3.5 - 5.1 mmol/L 4.0 3.6 3.7  Chloride 98 - 111 mmol/L 103 104 104  CO2 22 - 32 mmol/L '23 25 27  ' Calcium 8.9 - 10.3 mg/dL 9.4 9.0 8.8(L)  Total Protein 6.5 - 8.1 g/dL 8.0 6.6 7.0  Total Bilirubin 0.3 - 1.2 mg/dL 0.5 0.4 0.6  Alkaline Phos 38 - 126 U/L 78 61 62  AST 15 - 41 U/L 16 14(L) 18   ALT 0 - 44 U/L '8 11 14     ' PATHOLOGY: CASE: [id number]  PATIENT: Amaryllis Dyke  Surgical Pathology Report  *Addendum *  Reason for Addendum #1:  DNA Mismatch Repair IHC Results   Clinical History: Cecal mass (crm)   FINAL MICROSCOPIC DIAGNOSIS:   A. COLON, RIGHT, RESECTION:  - Invasive colorectal adenocarcinoma.  - See oncology table below.  - Tubular adenoma, 1.5 cm.  - Unremarkable proximal small intestinal and distal colonic resection  margins.  - Appendix is not identified.   ONCOLOGY TABLE:   COLON AND RECTUM, CARCINOMA:  Resection, Including Transanal Disk  Excision of Rectal Neoplasms   Procedure: Right hemicolectomy  Tumor Site: Cecum  Tumor Size: 5.1 cm  Macroscopic Tumor Perforation: Not identified  Histologic Type: Adenocarcinoma  Histologic Grade: G2, moderately differentiated  Multiple Primary Sites: Not applicable  Tumor Extension: Invades into muscularis propria  Lymphovascular Invasion: Not identified  Perineural Invasion: Present  Treatment Effect: No known presurgical therapy  Margins:       Margin Status for Invasive Carcinoma: All margins negative for  invasive carcinoma       Margins examined: Proximal, distal, and mesenteric       Distance from Invasive Carcinoma to Radial (Circumferential) Margin  (required for rectal            tumors): Not applicable (not a rectal tumor)       Distance from Invasive Carcinoma to Closest Mucosal Margin  (relevant and required only for            transanal disc excisions): Not applicable (not a transanal  disc excision)       Margin Status for Non-Invasive Tumor: All margins negative for  high-grade dysplasia/intramucosal carcinoma and low-grade dysplasia  Regional Lymph Nodes: All regional lymph nodes negative for tumor       Number of Lymph Nodes with Tumor: 0       Number of Lymph Nodes Examined: 36  Tumor Deposits: Not identified  Distant Metastasis:       Distant Site(s) Involved: Not  applicable  Pathologic Stage Classification (pTNM, AJCC 8th Edition): pT2, pN0  Ancillary Studies: MMR / MSI testing will be ordered.  Representative Tumor Block: A3  Comments: None   (v4.2.0.1)   GROSS DESCRIPTION:   A.  Received fresh and subsequently placed in formalin labeled with the  patient's name  and "Right colon" is a right hemicolectomy consisting of  terminal ileum (9.8 cm long, 4.0 cm in circumference), cecum (7.5 cm  long, 11.4 cm in circumference), and portion of ascending colon (20.1 cm  long, 10.2 cm in circumference) with up to 6.1 cm of attached  mesoappendix.  An appendix is not grossly identified.  Opening reveals a  5.1 x 4.5 x 0.8 cm tan-brown, indurated, fungating lesion in the cecum  located 2.5 cm from the ileocecal valve, 23.3 cm from the distal  resection margin (blue), and 4.9 cm from the fibrofatty resection margin  (orange). One cut surface, the mass extends up to 0.3 cm deep into the  wall, remaining 0.2 cm from the serosa (inked black). Also noted 12.6 cm  distal to the primary lesion is a 1.5 x 1.3 x 0.7 cm superficial tan,  indurated, fungating satellite lesion located 9.4 cm from the distal  margin. In the fibrofatty tissue, 37 tan, rubbery possible lymph nodes  ranging from 0.3-1.5 cm in greatest dimension are identified.  Block Summary  A1: resection margins, en face  A2: mass to serosa  A3: mass deepest extent  A4: mass to adjacent normal  A5: entire satellite lesion  A6: one node, sectioned  A7: one trisected node, one bisected node (inked black)  A8: three bisected nodes (differentially inked black, blue, and orange)  A9: three bisected nodes (differentially inked black, blue, and orange)  A10: six whole nodes  A11: six whole nodes  A12: six whole nodes  A13: five whole nodes  A14: five whole nodes   (LEF 08/06/2021)   Final Diagnosis performed by Quay Burow, MD.   Electronically signed  08/07/2021  Technical and / or Professional  components performed at [facility], [place] [address], [place], [place] [address].   Immunohistochemistry Technical component (if applicable) was performed  at [facility]. [address] [place],  [place],  [address].   IMMUNOHISTOCHEMISTRY DISCLAIMER (if applicable):  Some of these immunohistochemical stains may have been developed and the  performance characteristics determine by [facility]. Some  may not have been cleared or approved by the U.S. Food and Drug  Administration. The FDA has determined that such clearance or approval  is not necessary. This test is used for clinical purposes. It should not  be regarded as investigational or for research. This laboratory is  certified under the [place]  (CLIA-88) as qualified to perform high complexity clinical laboratory  testing.  The controls stained appropriately.    ADDENDUM:   Mismatch Repair Protein (IHC)   SUMMARY INTERPRETATION: NORMAL   There is preserved expression of the major MMR proteins. There is a very  low probability that microsatellite instability (MSI) is present.  However, certain clinically significant MMR protein mutations may result  in preservation of nuclear expression. It is recommended that the  preservation of protein expression be correlated with molecular based  MSI testing.   IHC EXPRESSION RESULTS   TEST           RESULT  MLH1:          Preserved nuclear expression  MSH2:          Preserved nuclear expression  MSH6:          Preserved nuclear expression  PMS2:          Preserved nuclear expression   References:  1. Guidelines on Genetic Evaluation and Management of Lynch  Syndrome: A  Consensus Statement by the [place] Multi-Society Task Force on Colorectal  Cancer Gae Dry. Sherlie Ban , MD, and other . Am Nicki [place] 2014;  [phone]; doi: 10.1038/ajg.2014.186; published online 10 April 2013  2. Outcomes of screening endometrial cancer patients for Lynch syndrome  by patient-administered checklist. Olena Heckle MS, and others. Gynecol Oncol  2013;131(3):619-623.  3. Muir-Torre syndrome (MTS): An update and approach to diagnosis and  management. Shelly Flatten, MD and others. J Am Acad Dermatol  [phone]   Addendum #1 performed by Jaquita Folds, MD.   Electronically signed  08/14/2021    RADIOGRAPHIC STUDIES: I have personally reviewed the radiological images as listed and agreed with the findings in the report. CT ABDOMEN PELVIS WO CONTRAST  Result Date: 08/01/2021 CLINICAL DATA:  Abdominal pain EXAM: CT ABDOMEN AND PELVIS WITHOUT CONTRAST TECHNIQUE: Multidetector CT imaging of the abdomen and pelvis was performed following the standard protocol without IV contrast. COMPARISON:  CT abdomen and pelvis dated October 08, 2009 FINDINGS: Lower chest: Mitral annular calcifications. Moderate hiatal hernia. No acute abnormality. Hepatobiliary: Low-attenuation lesion of the left hepatic lobe is unchanged compared to 2011 prior and likely a simple cyst. No suspicious liver lesions. Gallbladder is unremarkable. No biliary ductal dilation. Pancreas: Unremarkable. No pancreatic ductal dilatation or surrounding inflammatory changes. Spleen: Normal in size without focal abnormality. Adrenals/Urinary Tract: Adrenal glands are unremarkable. Kidneys are normal, without renal calculi, focal lesion, or hydronephrosis. Bladder is unremarkable for degree of decompression. Stomach/Bowel: Stomach is within normal limits. Appendix is not definitely visualized. No evidence of bowel wall thickening, distention, or inflammatory changes. Vascular/Lymphatic: Aortic atherosclerosis. No enlarged abdominal or pelvic lymph nodes. Reproductive: No adnexal masses. Other: No abdominal wall hernia or abnormality. No abdominopelvic ascites. Musculoskeletal: No acute or significant osseous findings. IMPRESSION: 1. No  acute findings in the abdomen or pelvis. 2. Aortic Atherosclerosis (ICD10-I70.0). Electronically Signed   By: Yetta Glassman M.D.   On: 08/01/2021 19:32   CT CHEST WO CONTRAST  Result Date: 08/04/2021 CLINICAL DATA:  Colorectal cancer, evaluate for meds EXAM: CT CHEST WITHOUT CONTRAST TECHNIQUE: Multidetector CT imaging of the chest was performed following the standard protocol without IV contrast. COMPARISON:  Chest radiograph 06/21/2020 FINDINGS: Cardiovascular: The heart is not enlarged. There is no pericardial effusion. There are mild mitral annular calcifications. There is mild calcified atherosclerotic plaque of the thoracic aorta. Mediastinum/Nodes: The imaged thyroid is unremarkable. The esophagus is grossly unremarkable. There is a small hiatal hernia. There is no mediastinal or axillary lymphadenopathy. There is no bulky hilar lymphadenopathy. Lungs/Pleura: The trachea and central airways are patent. The lungs are well inflated. There is no focal consolidation or pulmonary edema. There is no pleural effusion or pneumothorax. There is minimal scarring in the left lung base and right lower lobe. There are no suspicious nodules or masses. Upper Abdomen: A small cyst is again seen in the liver. The imaged portions of the upper abdominal viscera are otherwise unremarkable. Musculoskeletal: There is no acute osseous abnormality or aggressive osseous lesion. IMPRESSION: No evidence of metastatic disease or pathologic lymphadenopathy in the chest. Electronically Signed   By: Valetta Mole M.D.   On: 08/04/2021 15:47    ASSESSMENT & PLAN MARIADEJESUS CADE 80 y.o. 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  After review of the labs, review of the records, and discussion with the patient the patients findings are most consistent with a stage I  adenocarcinoma of the cecum adequately resected.  At this  time would recommend a follow-up colonoscopy approximately 1 year after the surgical resection.  This could be performed by either gastroenterology or surgery.  We will plan to see the patient back in 1 years time in order to reevaluate for this.  Patient was noted to have iron deficiency anemia while in the hospital.  Today we will recheck her iron levels as well as her CBC, CMP, and full iron panel.  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 --Routine imaging for her resected stage I colon cancers is not recommended.  Neither is serial CEA measurements. 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  She dropped as low as 7.4 during her inpatient stay --Today we will recheck iron panel, ferritin, reticulocyte panel, CBC, CMP --Recommend the patient start ferrous sulfate 325 mg p.o. daily with a source of vitamin C.  If she is unable to tolerate this would consider IV iron therapy --Return to clinic in 3 months time in order to reassess.  Orders Placed This Encounter  Procedures   CBC with Differential ([place] Only)    Standing Status:   Future    Number of Occurrences:   1    Standing Expiration Date:   08/27/2022   CMP ([place] only)    Standing Status:   Future    Number of Occurrences:   1    Standing Expiration Date:   08/27/2022   Ferritin    Standing Status:   Future    Number of Occurrences:   1    Standing Expiration Date:   08/27/2022   Iron and TIBC    Standing Status:   Future    Number of Occurrences:   1    Standing Expiration Date:   08/27/2022   Retic Panel    Standing Status:   Future    Number of  Occurrences:   1    Standing Expiration Date:   08/27/2022   All questions were answered. The patient knows to call the clinic with any problems, questions or concerns.  A total of more than 60 minutes were spent on this encounter with face-to-face time and non-face-to-face time, including preparing to see the patient, ordering tests and/or medications, counseling the patient and coordination of care as outlined above.   Ledell Peoples, MD Department of Hematology/Oncology [place] [facility] Phone: [phone] Pager: [phone] Email: [email or web address]  08/27/2021 4:03 PM

## 2021-08-28 ENCOUNTER — Telehealth: Payer: Self-pay | Admitting: *Deleted

## 2021-08-28 MED ORDER — FERROUS SULFATE 325 (65 FE) MG PO TABS: 325.0000 mg | ORAL_TABLET | Freq: Every day | ORAL | 3 refills | Status: AC

## 2021-08-28 NOTE — Addendum Note (Signed)
Addended by: Aura Fey A on: 08/28/2021 11:48 AM   Modules accepted: Orders

## 2021-08-28 NOTE — Telephone Encounter (Signed)
-----   Message from Orson Slick, MD sent at 08/28/2021  8:39 AM EST ----- Please let [name] know that her labs show a mild anemia with modestly low iron levels.  I know she has been struggling with constipation but we can try ferrous sulfate 325 mg every other day with a source of vitamin C.  If this proves to be too constipating for her we could consider administration of IV iron therapy.  Please call in the p.o. iron therapy to a pharmacy of her choosing. ----- Message ----- From: Buel Ream, Lab In [place] Sent: 08/27/2021   2:49 PM EST To: Orson Slick, MD

## 2021-08-28 NOTE — Telephone Encounter (Signed)
Received call back from [name]. Reviewed her labs with her and advised that she has mild iron deficient anemia. Dr. Lorenso Courier recommends oral iron supplement.  Advised that it can cause constipation. Pt was very constipated yesterday when she was here. She was able to use fleet enema with relief. Advised that she should take something daily for this. She states she has started metamucil. Advised to call us if the iron is too constipating for her. She voiced understanding.  Prescription escribed to her pharmacy.

## 2021-08-28 NOTE — Telephone Encounter (Signed)
TCT patient No answer on home # and vm is full. TCT to mobile # , no answer but was able to leave vm message for pt to call back to this office @ [phone]

## 2022-04-21 ENCOUNTER — Telehealth: Payer: Self-pay | Admitting: Hematology and Oncology

## 2022-04-21 NOTE — Telephone Encounter (Signed)
Called patient regarding December appointments, patient is notified.  

## 2022-06-05 IMAGING — CT CT ABD-PELV W/O CM
2 of 4 series · 17 of 46 positions shown, 19 images · non-contrast
Comparison: CT abdomen and pelvis dated October 08, 2009

CLINICAL DATA: Abdominal pain

EXAM:
CT ABDOMEN AND PELVIS WITHOUT CONTRAST
TECHNIQUE: Multidetector CT imaging of the abdomen and pelvis was performed
following the standard protocol without IV contrast.

[Series 2: axial st · axial · 0.78mm/px · z∈[+1072,+1412]mm · 14 of 78 slices shown, 16 images]
[im 5/78  soft-tissue]
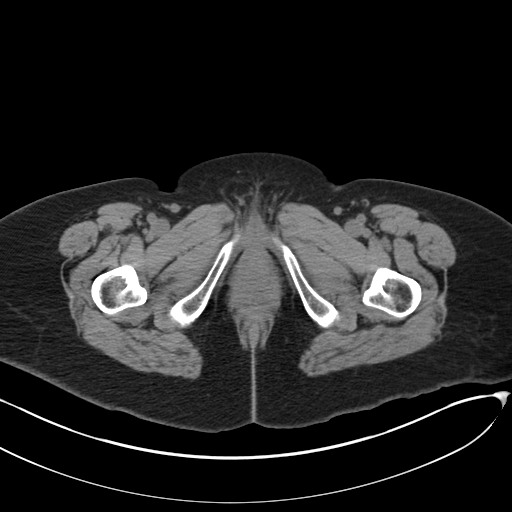
[im 5/78  bone]
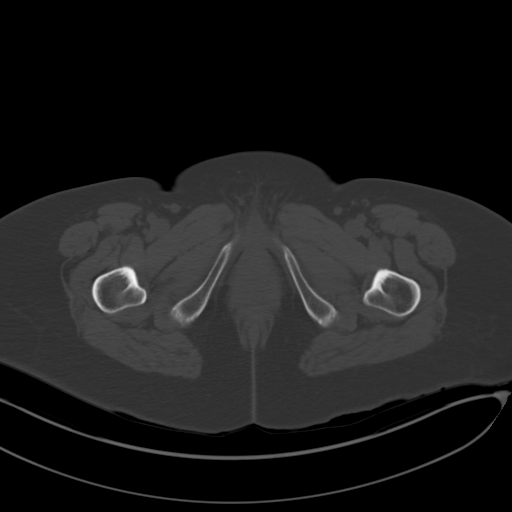
[im 9/78  soft-tissue]
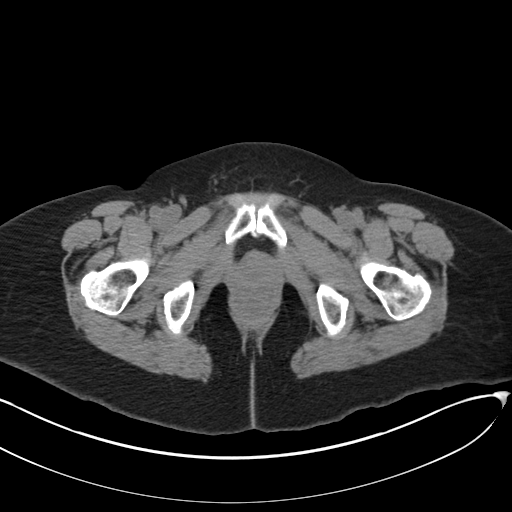
[im 18/78  soft-tissue]
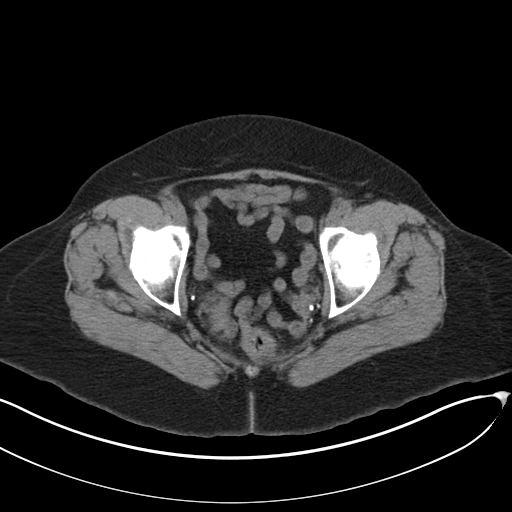
[im 22/78  soft-tissue]
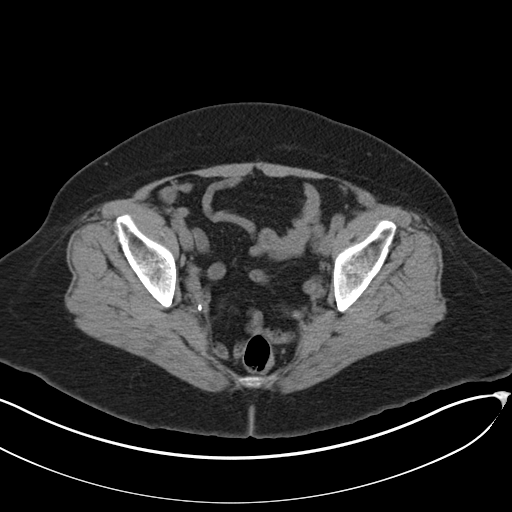
[im 26/78  soft-tissue]
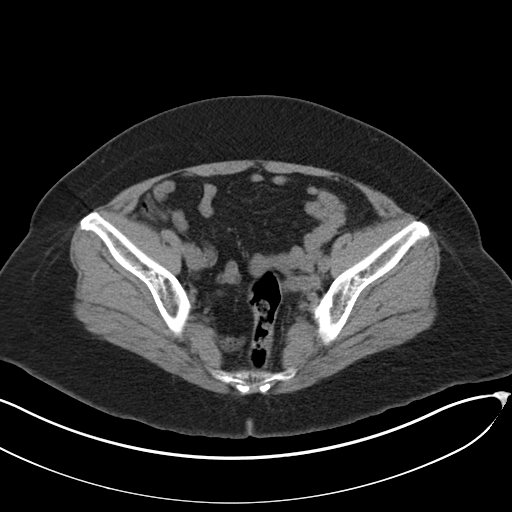
[im 30/78  soft-tissue]
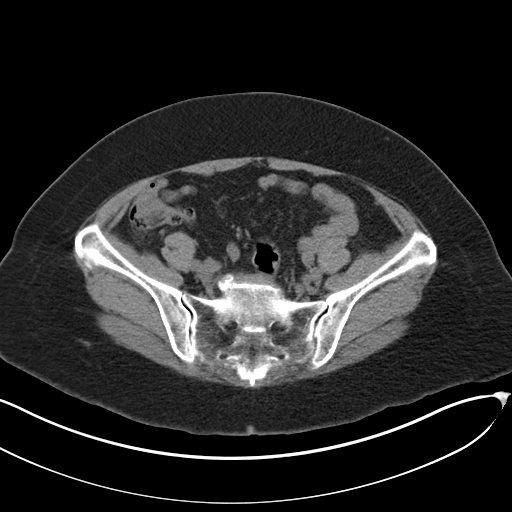
[im 35/78  soft-tissue]
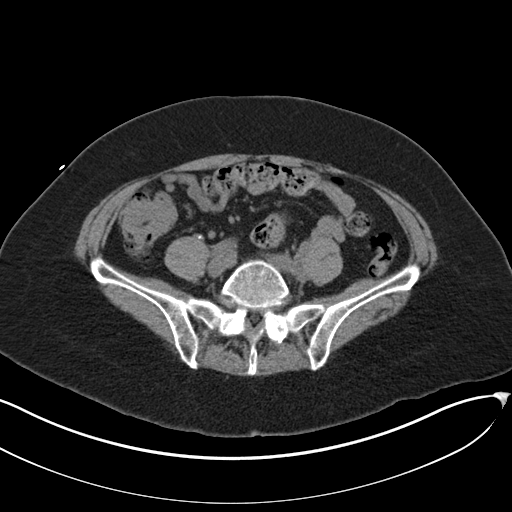
[im 43/78  soft-tissue]
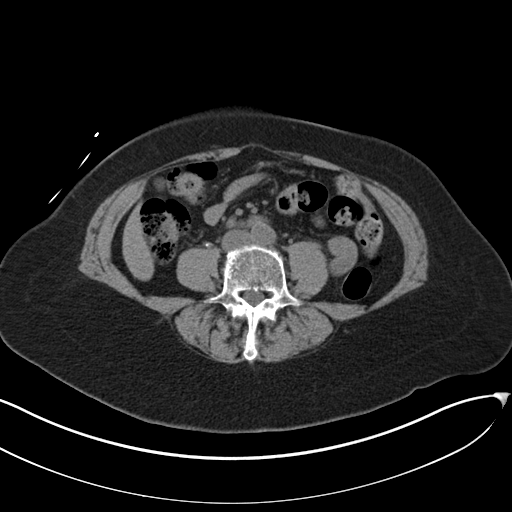
[im 48/78  soft-tissue]
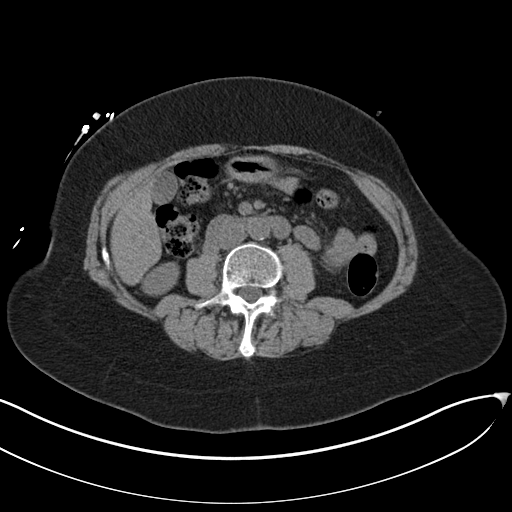
[im 48/78  bone]
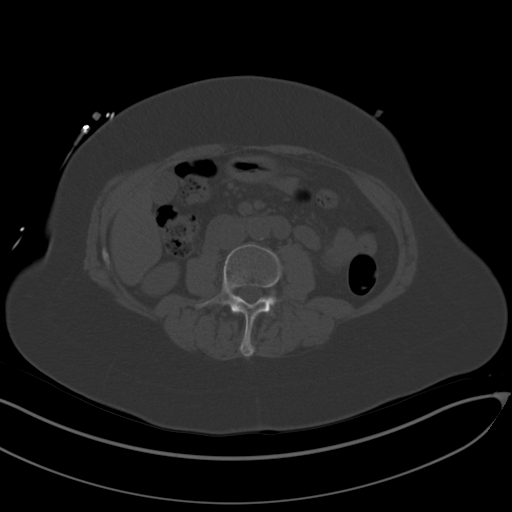
[im 52/78  soft-tissue]
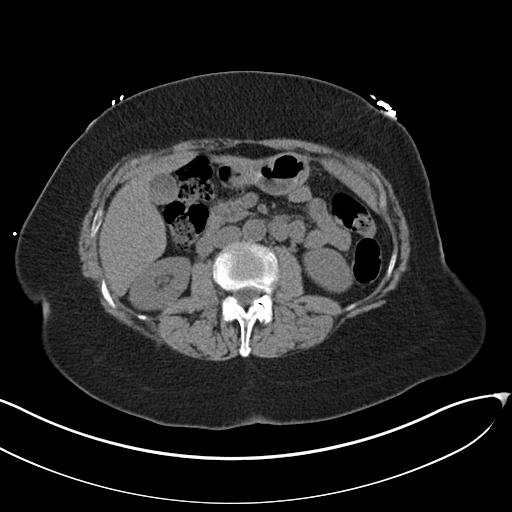
[im 56/78  soft-tissue]
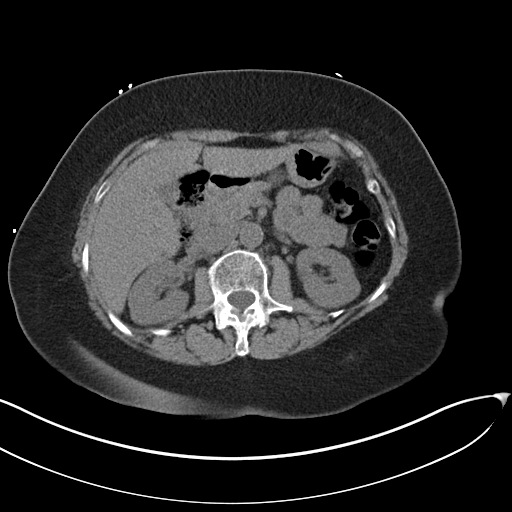
[im 60/78  soft-tissue]
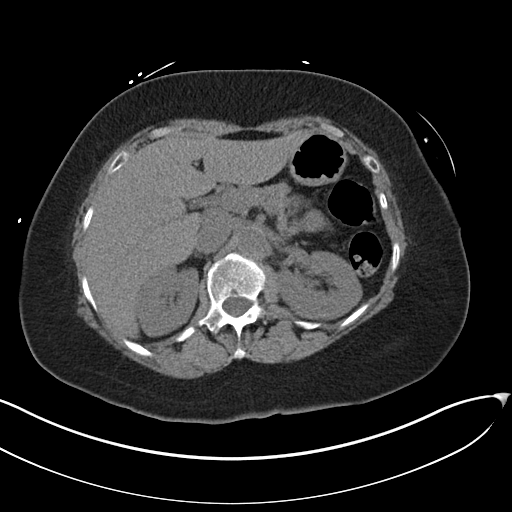
[im 69/78  soft-tissue]
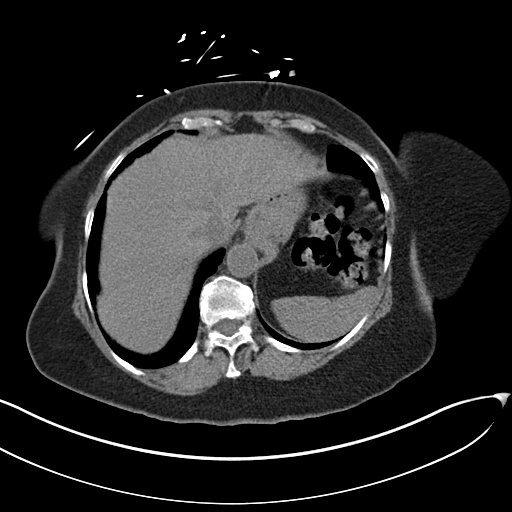
[im 73/78  soft-tissue]
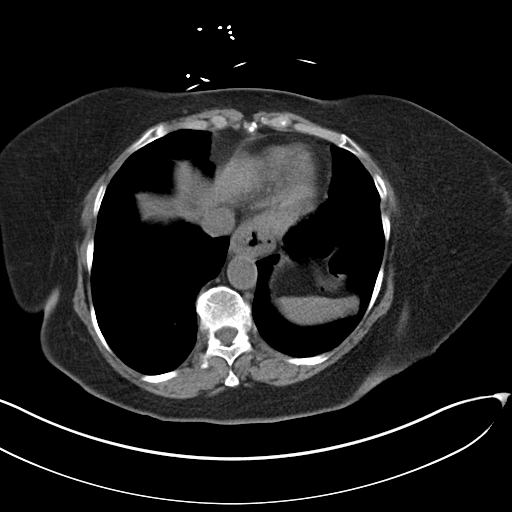

[Series 5: coronal st · coronal · 0.75mm/px · 3 of 151 slices shown]
[im 51/151  soft-tissue]
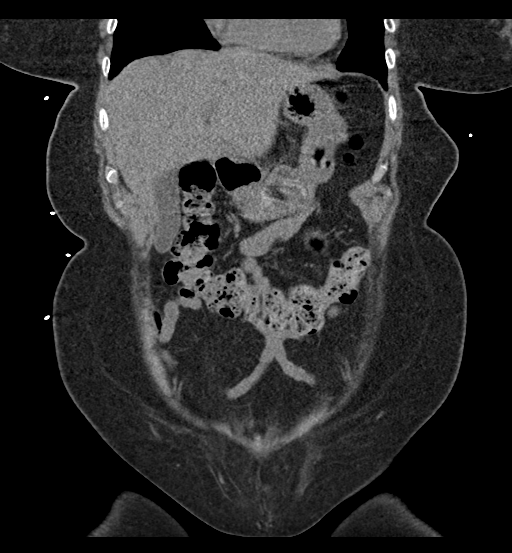
[im 67/151  soft-tissue]
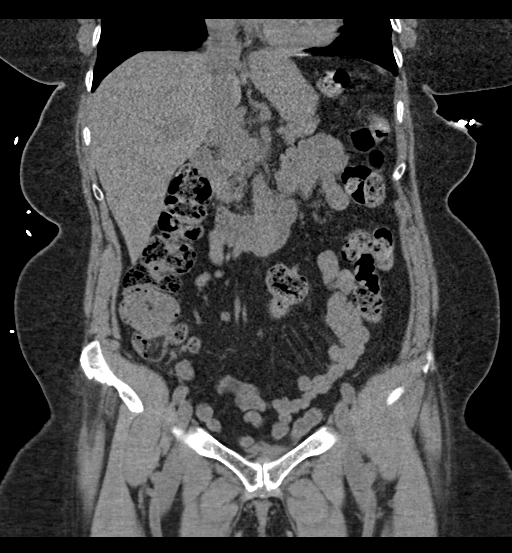
[im 84/151  soft-tissue]
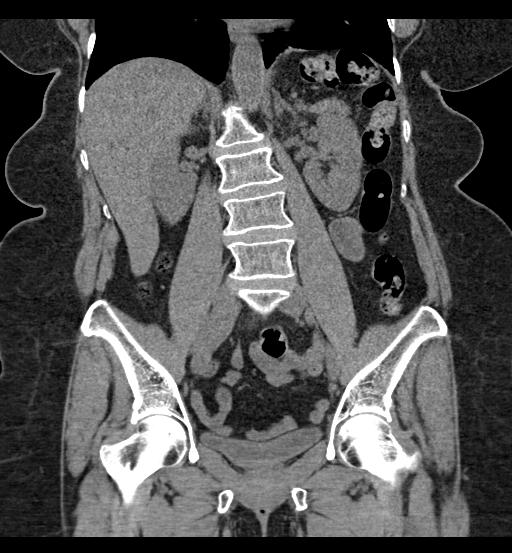

[17 of 46 positions shown; findings below may reference images not displayed]

FINDINGS: Lower chest: Mitral annular calcifications. Moderate hiatal hernia.
No acute abnormality.

Hepatobiliary: Low-attenuation lesion of the left hepatic lobe is
unchanged compared to 9299 prior and likely a simple cyst. No
suspicious liver lesions. Gallbladder is unremarkable. No biliary
ductal dilation.

Pancreas: Unremarkable. No pancreatic ductal dilatation or
surrounding inflammatory changes.

Spleen: Normal in size without focal abnormality.

Adrenals/Urinary Tract: Adrenal glands are unremarkable. Kidneys are
normal, without renal calculi, focal lesion, or hydronephrosis.
Bladder is unremarkable for degree of decompression.

Stomach/Bowel: Stomach is within normal limits. Appendix is not
definitely visualized. No evidence of bowel wall thickening,
distention, or inflammatory changes.

Vascular/Lymphatic: Aortic atherosclerosis. No enlarged abdominal or
pelvic lymph nodes.

Reproductive: No adnexal masses.

Other: No abdominal wall hernia or abnormality. No abdominopelvic
ascites.

Musculoskeletal: No acute or significant osseous findings.
IMPRESSION: 1. No acute findings in the abdomen or pelvis.
2. Aortic Atherosclerosis ([id number]).

## 2022-06-08 IMAGING — CT CT CHEST W/O CM
2 of 4 series · 15 of 36 positions shown, 18 images · non-contrast
Comparison: Chest radiograph 06/21/2020

CLINICAL DATA: Colorectal cancer, evaluate for meds

EXAM:
CT CHEST WITHOUT CONTRAST
TECHNIQUE: Multidetector CT imaging of the chest was performed following the
standard protocol without IV contrast.

[Series 2: thorax · axial · 0.63mm/px · z∈[+1394,+1652]mm · 12 of 151 slices shown, 15 images]
[im 11/151  mediastinal]
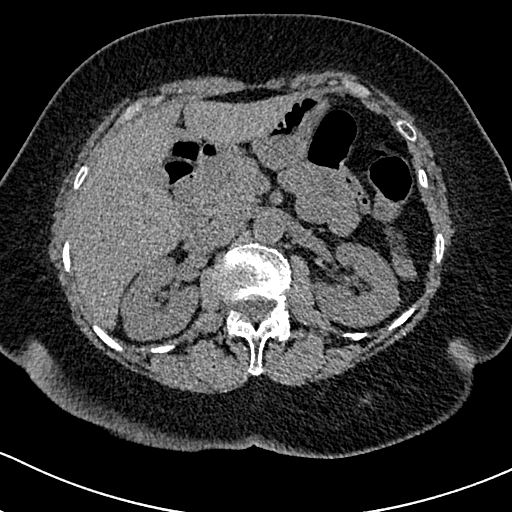
[im 11/151  lung]
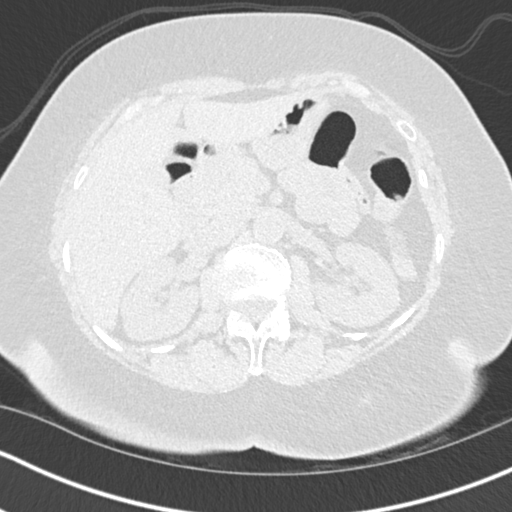
[im 22/151  lung]
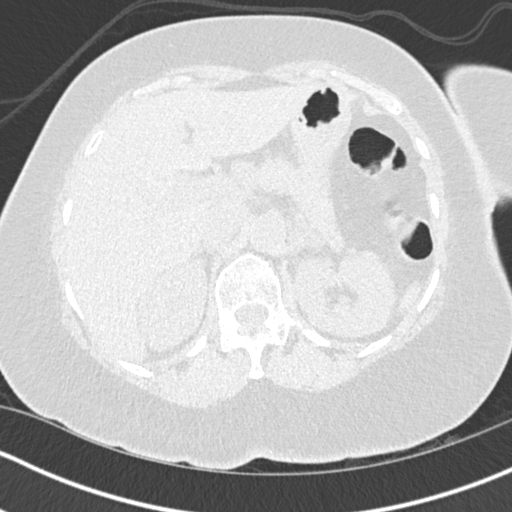
[im 33/151  lung]
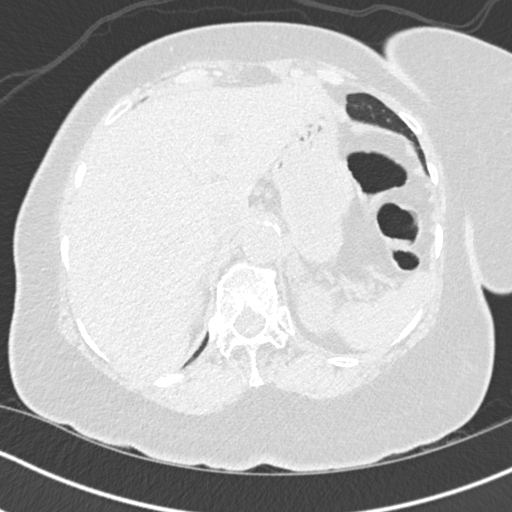
[im 43/151  lung]
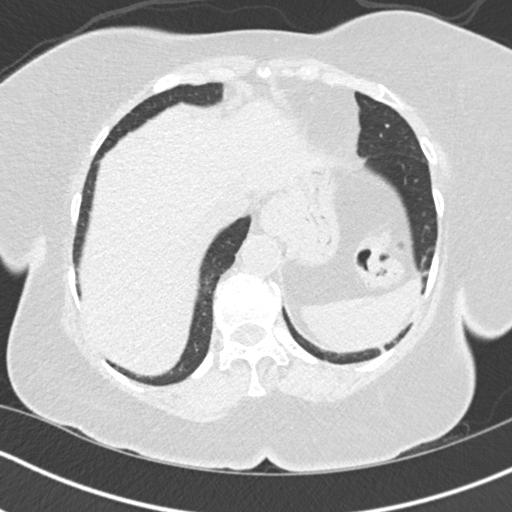
[im 54/151  mediastinal]
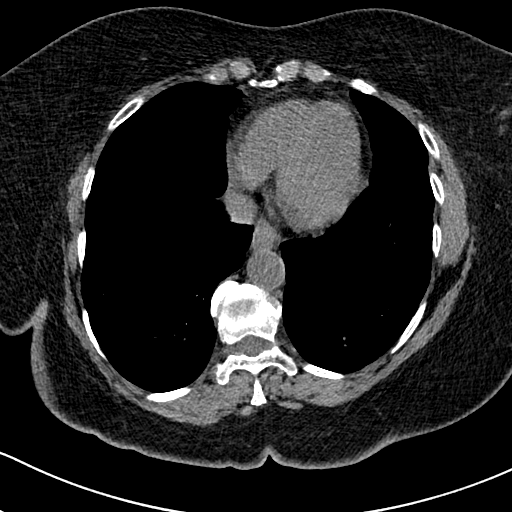
[im 54/151  lung]
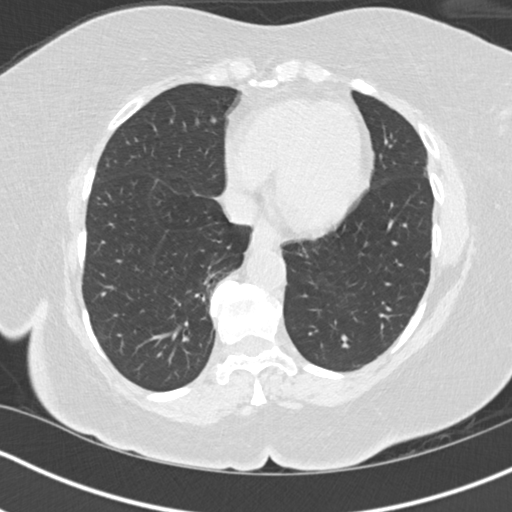
[im 65/151  lung]
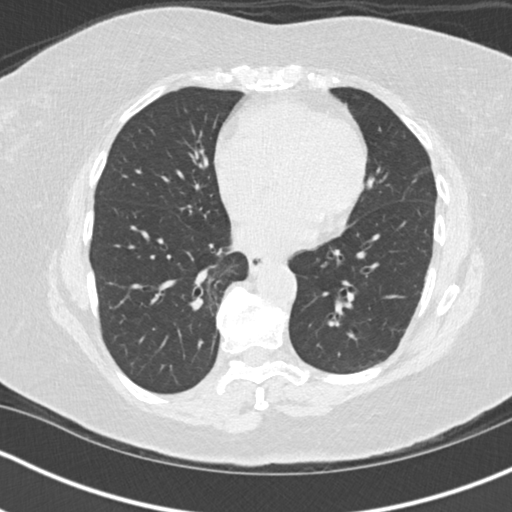
[im 86/151  lung]
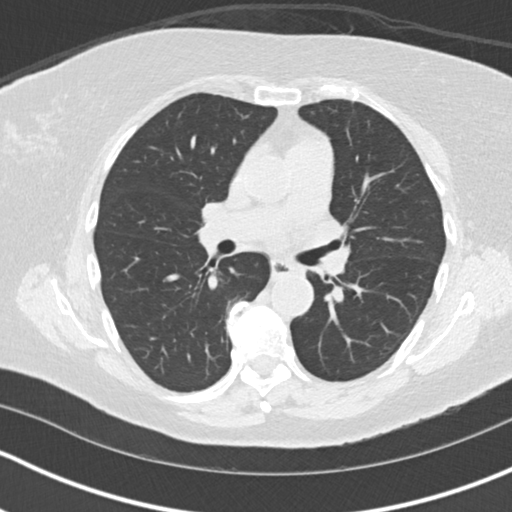
[im 97/151  lung]
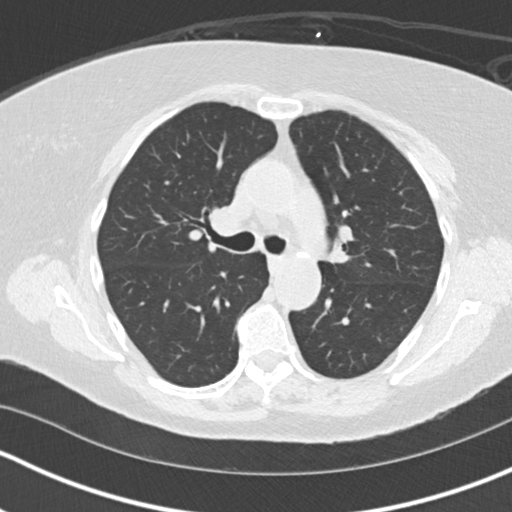
[im 108/151  mediastinal]
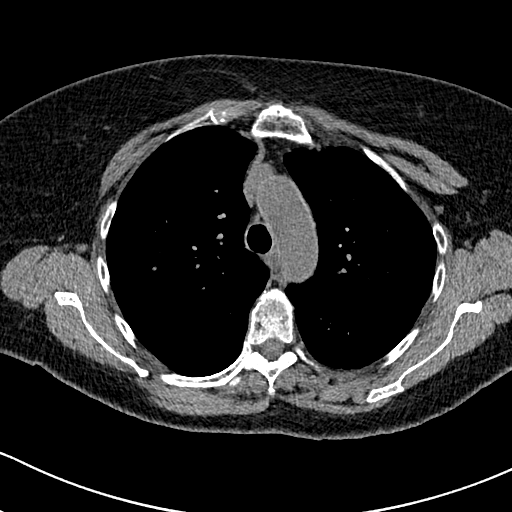
[im 108/151  lung]
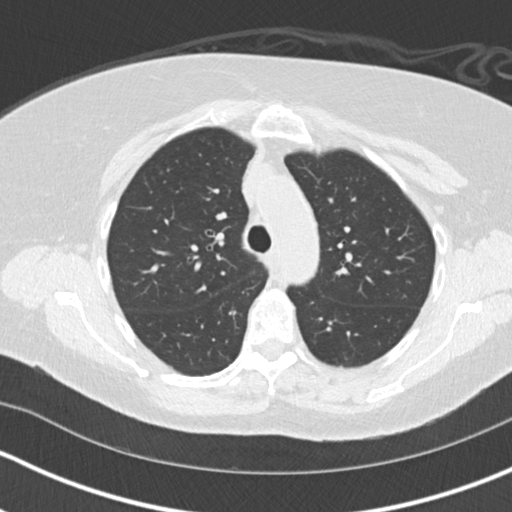
[im 118/151  lung]
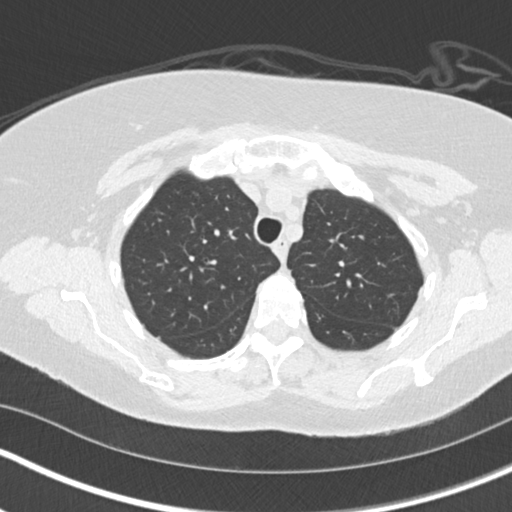
[im 129/151  lung]
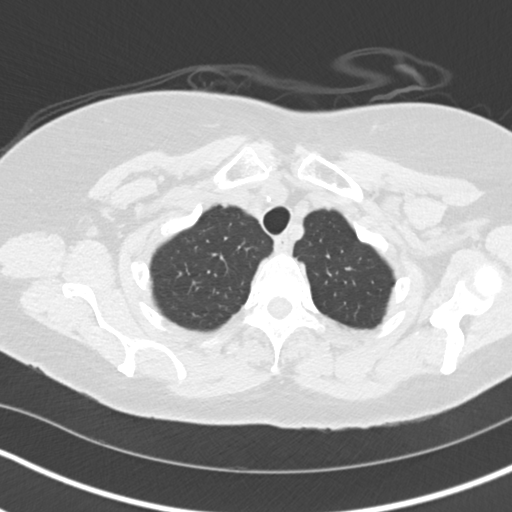
[im 140/151  lung]
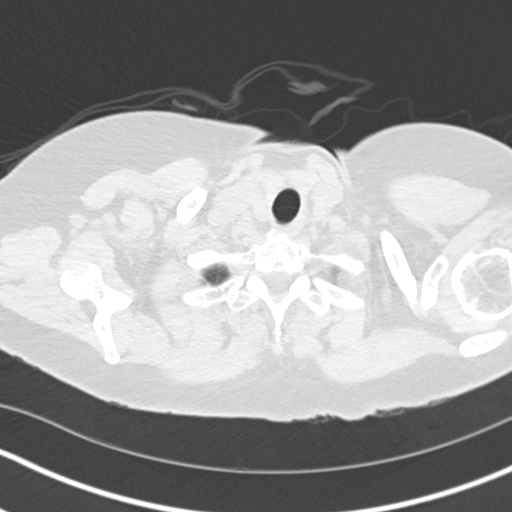

[Series 5: coronal · coronal · 0.59mm/px · 3 of 123 slices shown]
[im 25/123  lung]
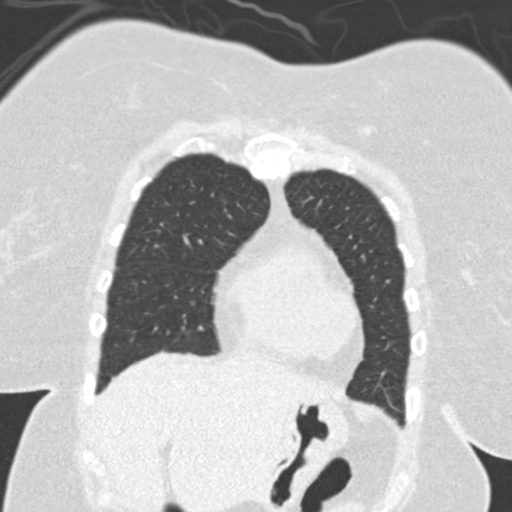
[im 49/123  lung]
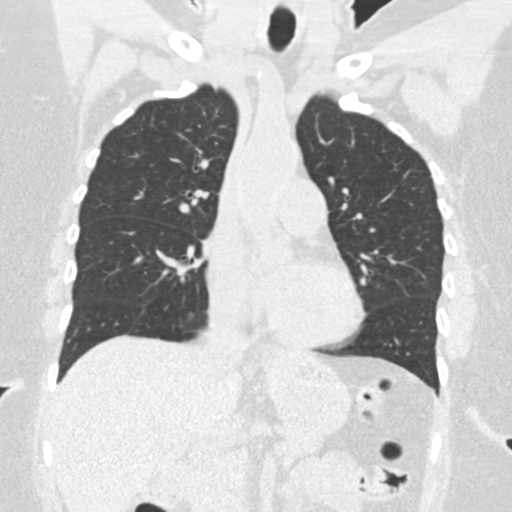
[im 74/123  lung]
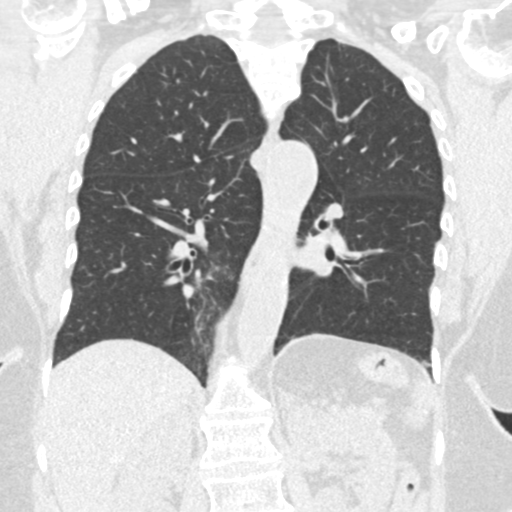

[15 of 36 positions shown; findings below may reference images not displayed]

FINDINGS: Cardiovascular: The heart is not enlarged. There is no pericardial
effusion. There are mild mitral annular calcifications. There is
mild calcified atherosclerotic plaque of the thoracic aorta.

Mediastinum/Nodes: The imaged thyroid is unremarkable. The esophagus
is grossly unremarkable. There is a small hiatal hernia. There is no
mediastinal or axillary lymphadenopathy. There is no bulky hilar
lymphadenopathy.

Lungs/Pleura: The trachea and central airways are patent.

The lungs are well inflated. There is no focal consolidation or
pulmonary edema. There is no pleural effusion or pneumothorax. There
is minimal scarring in the left lung base and right lower lobe.
There are no suspicious nodules or masses.

Upper Abdomen: A small cyst is again seen in the liver. The imaged
portions of the upper abdominal viscera are otherwise unremarkable.

Musculoskeletal: There is no acute osseous abnormality or aggressive
osseous lesion.
IMPRESSION: No evidence of metastatic disease or pathologic lymphadenopathy in
the chest.

## 2022-08-17 ENCOUNTER — Telehealth: Payer: Self-pay | Admitting: Hematology and Oncology

## 2022-08-17 NOTE — Telephone Encounter (Signed)
Per provider pal called pt with no answer and no VM  will try pt again

## 2022-08-24 ENCOUNTER — Other Ambulatory Visit: Payer: Self-pay | Admitting: Physician Assistant

## 2022-08-24 DIAGNOSIS — D500 Iron deficiency anemia secondary to blood loss (chronic): Secondary | ICD-10-CM

## 2022-08-25 ENCOUNTER — Inpatient Hospital Stay: Payer: Medicare PPO | Admitting: Physician Assistant

## 2022-08-25 ENCOUNTER — Inpatient Hospital Stay: Payer: Medicare PPO | Attending: Nurse Practitioner

## 2022-08-26 ENCOUNTER — Other Ambulatory Visit: Payer: Medicare PPO

## 2022-08-26 ENCOUNTER — Ambulatory Visit: Payer: Medicare PPO | Admitting: Hematology and Oncology

## 2023-03-30 ENCOUNTER — Other Ambulatory Visit: Payer: Self-pay | Admitting: Nurse Practitioner

## 2023-03-30 DIAGNOSIS — M064 Inflammatory polyarthropathy: Secondary | ICD-10-CM

## 2023-11-26 ENCOUNTER — Ambulatory Visit
Admission: EM | Admit: 2023-11-26 | Discharge: 2023-11-26 | Disposition: A | Discharge status: Home / Self Care | Attending: Internal Medicine | Admitting: Internal Medicine

## 2023-11-26 DIAGNOSIS — J069 Acute upper respiratory infection, unspecified: Secondary | ICD-10-CM

## 2023-11-26 MED ORDER — FLUTICASONE PROPIONATE 50 MCG/ACT NA SUSP: 1.0000 | Freq: Every day | NASAL | 0 refills | Status: AC

## 2023-11-26 NOTE — ED Triage Notes (Signed)
 Patient presents to UC for stuffy nose, rhinorrhea x 3 days. Last known fever 2 days ago. Hx of sinus infections. Treating symptoms with Theraflu. Tylenol once for fever.

## 2023-11-26 NOTE — ED Provider Notes (Signed)
 [facility]    CSN: [id number] Arrival date & time: 11/26/23  1111      History   Chief Complaint Chief Complaint  Patient presents with   Headache    HPI [name] is a 83 y.o. female.   Patient presents with nasal congestion and runny nose that has been present for about 2 to 3 days.  Patient denies any fevers.  Patient does report that she has felt feverish.  Reports very mild cough but states this is most likely due to postnasal drip.  Has been taking TheraFlu and Tylenol.  Patient denies history of asthma or COPD and has never smoked cigarettes.  Reports possible COVID exposure at her workplace.   Headache   History reviewed. No pertinent past medical history.  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   Symptomatic anemia 08/01/2021   GI bleed 08/01/2021   Abnormal urinalysis 08/01/2021    Past Surgical History:  Procedure Laterality Date   BIOPSY  08/03/2021   Procedure: BIOPSY;  Surgeon: Willis Modena, MD;  Location: [facility];  Service: Gastroenterology;;   COLONOSCOPY WITH PROPOFOL N/A 08/03/2021   Procedure: COLONOSCOPY WITH PROPOFOL;  Surgeon: Willis Modena, MD;  Location: [facility];  Service: Gastroenterology;  Laterality: N/A;   ESOPHAGOGASTRODUODENOSCOPY N/A 08/03/2021   Procedure: ESOPHAGOGASTRODUODENOSCOPY (EGD);  Surgeon: Willis Modena, MD;  Location: [facility];  Service: Gastroenterology;  Laterality: N/A;   PARTIAL COLECTOMY Right 08/05/2021   Procedure: RIGHT COLECTOMY;  Surgeon: Darnell Level, MD;  Location: [facility];  Service: General;  Laterality: Right;    OB History   No obstetric history on file.      Home Medications    Prior to Admission medications   Medication Sig Start Date End Date Taking? Authorizing Provider  fluticasone (FLONASE) 50 MCG/ACT nasal spray Place 1 spray into  both nostrils daily. 11/26/23  Yes Senica Crall, Rolly Salter E, FNP  cholecalciferol (VITAMIN D3) 25 MCG (1000 UNIT) tablet Take 1,000 Units by mouth every other day.    [provider]  docusate sodium (COLACE) 100 MG capsule Take 1 capsule (100 mg total) by mouth 2 (two) times daily as needed for mild constipation or moderate constipation. 08/09/21   Eric Form, PA-C  ferrous sulfate (FERROUSUL) 325 (65 FE) MG tablet Take 1 tablet (325 mg total) by mouth daily with breakfast. Pt to take every other day with a source of vitamin C 08/28/21   Jaci Standard, MD  hydrocortisone (ANUSOL-HC) 25 MG suppository Place 1 suppository (25 mg total) rectally 2 (two) times daily. Patient not taking: Reported on 08/01/2021 02/28/21   Renne Crigler, PA-C  metFORMIN (GLUCOPHAGE) 500 MG tablet Take 1 tablet (500 mg total) by mouth daily with breakfast. 08/11/21 09/10/21  Zigmund Daniel., MD  Multiple Vitamin (MULTIVITAMIN ADULT) TABS Take 1 tablet by mouth daily.    [provider]    Family History History reviewed. No pertinent family history.  Social History Social History   Tobacco Use   Smoking status: Never   Smokeless tobacco: Never  Substance Use Topics   Alcohol use: No   Drug use: No     Allergies   Haemophilus influenzae vaccines, Iodinated contrast media, and Penicillins   Review of Systems Review of Systems Per HPI  Physical Exam Triage Vital Signs ED Triage Vitals [11/26/23 1136]  Encounter Vitals Group  BP 117/75     Systolic BP Percentile      Diastolic BP Percentile      Pulse Rate 77     Resp 16     Temp 98.4 F (36.9 C)     Temp Source Oral     SpO2 95 %     Weight      Height      Head Circumference      Peak Flow      Pain Score      Pain Loc      Pain Education      Exclude from Growth Chart    No data found.  Updated Vital Signs BP 117/75 (BP Location: Left Arm)   Pulse 77   Temp 98.4 F (36.9 C) (Oral)   Resp 16   SpO2 95%    Visual Acuity Right Eye Distance:   Left Eye Distance:   Bilateral Distance:    Right Eye Near:   Left Eye Near:    Bilateral Near:     Physical Exam Constitutional:      General: She is not in acute distress.    Appearance: Normal appearance. She is not toxic-appearing or diaphoretic.  HENT:     Head: Normocephalic and atraumatic.     Right Ear: Tympanic membrane and ear canal normal.     Left Ear: Tympanic membrane and ear canal normal.     Nose: Congestion present.     Mouth/Throat:     Mouth: Mucous membranes are moist.     Pharynx: No posterior oropharyngeal erythema.  Eyes:     Extraocular Movements: Extraocular movements intact.     Conjunctiva/sclera: Conjunctivae normal.     Pupils: Pupils are equal, round, and reactive to light.  Cardiovascular:     Rate and Rhythm: Normal rate and regular rhythm.     Pulses: Normal pulses.     Heart sounds: Normal heart sounds.  Pulmonary:     Effort: Pulmonary effort is normal. No respiratory distress.     Breath sounds: Normal breath sounds. No stridor. No wheezing, rhonchi or rales.  Musculoskeletal:        General: Normal range of motion.     Cervical back: Normal range of motion.  Skin:    General: Skin is warm and dry.  Neurological:     General: No focal deficit present.     Mental Status: She is alert and oriented to person, place, and time. Mental status is at baseline.  Psychiatric:        Mood and Affect: Mood normal.        Behavior: Behavior normal.      UC Treatments / Results  Labs (all labs ordered are listed, but only abnormal results are displayed) Labs Reviewed - No data to display  EKG   Radiology No results found.  Procedures Procedures (including critical care time)  Medications Ordered in UC Medications - No data to display  Initial Impression / Assessment and Plan / UC Course  I have reviewed the triage vital signs and the nursing notes.  Pertinent labs & imaging results that  were available during my care of the patient were reviewed by me and considered in my medical decision making (see chart for details).     Differential diagnoses include allergic rhinitis versus viral illness.  Given COVID exposure, patient was offered COVID and flu testing but she declined this.  Will treat with Flonase as patient denies that she  uses any daily nasal sprays.  Advised adequate fluids, rest, symptom management.  Patient verbalized understanding and was agreeable with plan. Final Clinical Impressions(s) / UC Diagnoses   Final diagnoses:  Viral upper respiratory infection     Discharge Instructions      Suspect that you have a viral illness as we discussed that should simply run its course.  I have prescribed you a nasal spray to help with symptoms.  Follow-up if any symptoms persist or worsen.    ED Prescriptions     Medication Sig Dispense Auth. Provider   fluticasone (FLONASE) 50 MCG/ACT nasal spray Place 1 spray into both nostrils daily. 16 g Gustavus Bryant, [place]      PDMP not reviewed this encounter.   Gustavus Bryant, [place] 11/26/23 [phone]

## 2023-11-26 NOTE — Discharge Instructions (Signed)
 Suspect that you have a viral illness as we discussed that should simply run its course.  I have prescribed you a nasal spray to help with symptoms.  Follow-up if any symptoms persist or worsen.

## 2024-05-15 ENCOUNTER — Emergency Department (HOSPITAL_BASED_OUTPATIENT_CLINIC_OR_DEPARTMENT_OTHER)
Admission: EM | Admit: 2024-05-15 | Discharge: 2024-05-15 | Disposition: A | Discharge status: Home / Self Care | Attending: Emergency Medicine | Admitting: Emergency Medicine

## 2024-05-15 ENCOUNTER — Encounter (HOSPITAL_BASED_OUTPATIENT_CLINIC_OR_DEPARTMENT_OTHER): Payer: Self-pay | Admitting: Emergency Medicine

## 2024-05-15 ENCOUNTER — Other Ambulatory Visit: Payer: Self-pay

## 2024-05-15 ENCOUNTER — Emergency Department (HOSPITAL_BASED_OUTPATIENT_CLINIC_OR_DEPARTMENT_OTHER)

## 2024-05-15 DIAGNOSIS — Y92410 Unspecified street and highway as the place of occurrence of the external cause: Secondary | ICD-10-CM | POA: Insufficient documentation

## 2024-05-15 DIAGNOSIS — R519 Headache, unspecified: Secondary | ICD-10-CM | POA: Insufficient documentation
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
